# Patient Record
Sex: Male | Born: 1995 | Race: White | Hispanic: No | Marital: Single | Smoking: Former smoker
Health system: Southern US, Community
[De-identification: ages and names within clinical notes are randomized; demographics above are authoritative.]

## PROBLEM LIST (undated history)

## (undated) DIAGNOSIS — F329 Major depressive disorder, single episode, unspecified: Secondary | ICD-10-CM

## (undated) DIAGNOSIS — J309 Allergic rhinitis, unspecified: Secondary | ICD-10-CM

## (undated) HISTORY — DX: Allergic rhinitis, unspecified: J30.9

## (undated) HISTORY — DX: Major depressive disorder, single episode, unspecified: F32.9

## (undated) HISTORY — PX: NO PAST SURGERIES: SHX2092

---
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
## 2016-01-28 ENCOUNTER — Encounter: Payer: Self-pay | Admitting: Internal Medicine

## 2016-01-28 ENCOUNTER — Ambulatory Visit (INDEPENDENT_AMBULATORY_CARE_PROVIDER_SITE_OTHER): Payer: BLUE CROSS/BLUE SHIELD | Admitting: Internal Medicine

## 2016-01-28 VITALS — BP 112/68 | HR 104 | Temp 98.2°F | Resp 12 | Ht 74.0 in | Wt 160.1 lb

## 2016-01-28 DIAGNOSIS — J0100 Acute maxillary sinusitis, unspecified: Secondary | ICD-10-CM | POA: Diagnosis not present

## 2016-01-28 MED ORDER — AMOXICILLIN 875 MG PO TABS: 875.0000 mg | ORAL_TABLET | Freq: Two times a day (BID) | ORAL | 0 refills | Status: DC

## 2016-01-28 NOTE — Progress Notes (Signed)
   Subjective:    Patient ID: Joshua [name], male    DOB: 03/08/1996, 20 y.o.   MRN: [id number]  DOS:  01/28/2016 Type of visit - description : Acute visit Interval history: Symptoms started 2 weeks ago with sinus congestion, 5 days ago he also developed chest congestion At this point the worst symptom is left-sided facial pressure around the eye, on and off throughout the day, usually better after he blows yellow discharge from the sinuses. Yesterday he saw a mild amount of blood from the ear after he used a Q-tip   Review of Systems Denies fever chills + Cough with clear to white sputum production  Past Medical History:  Diagnosis Date  . Allergic rhinitis     Past Surgical History:  Procedure Laterality Date  . NO PAST SURGERIES      Social History   Social History  . Marital status: Single    Spouse name: N/A  . Number of children: N/A  . Years of education: N/A   Occupational History  . Not on file.   Social History Main Topics  . Smoking status: Never Smoker  . Smokeless tobacco: Never Used  . Alcohol use No  . Drug use: No  . Sexual activity: Not on file   Other Topics Concern  . Not on file   Social History Narrative  . No narrative on file        Medication List       Accurate as of 01/28/16 11:13 AM. Always use your most recent med list.          fluticasone 50 MCG/ACT nasal spray Commonly known as:  FLONASE Place 2 sprays into both nostrils daily.   ibuprofen 800 MG tablet Commonly known as:  ADVIL,MOTRIN Take 1 tablet (800 mg total) by mouth 3 (three) times daily.          Objective:   Physical Exam BP 112/68 (BP Location: Left Arm, Patient Position: Sitting, Cuff Size: Normal)   Pulse (!) 104   Temp 98.2 F (36.8 C) (Oral)   Resp 12   Ht 6\' 2"  (1.88 m)   Wt 160 lb 2 oz (72.6 kg)   SpO2 99%   BMI 20.56 kg/m  General:   Well developed, well nourished . NAD.  HEENT:  Normocephalic . Face symmetric, atraumatic. TMs: Both  are slightly bulge but not read. Canals are normal except for a very small excoriation on the left. No active bleeding. Throat symmetric. Sinuses: Slightly TTP at the left maxillary area.  EOMI Lungs:  CTA B Normal respiratory effort, no intercostal retractions, no accessory muscle use. Heart: RRR,  no murmur.  No pretibial edema bilaterally  Skin: Not pale. Not jaundice Neurologic:  alert & oriented X3.  Speech normal, gait appropriate for age and unassisted Psych--  Cognition and judgment appear intact.  Cooperative with normal attention span and concentration.  Behavior appropriate. No anxious or depressed appearing.      Assessment & Plan:   Assessment Allergic rhinitis C. diff 2015   Plan: Acute sinusitis: Clinical picture consistent with acute sinusitis, he does need an antibiotic; he has a history of C. difficile. Plan: Amoxicillin for 7 days, probiotics, call if problems, see AVS.

## 2016-01-28 NOTE — Progress Notes (Signed)
Pre visit review using our clinic review tool, if applicable. No additional management support is needed unless otherwise documented below in the visit note. 

## 2016-01-28 NOTE — Patient Instructions (Signed)
Start taking the antibiotic for one week  Flonase over-the-counter 2 sprays on each side of the nose every day  Mucinex twice a day for 4-5 days then as needed  For the next 3 weeks take over-the-counter probiotic such as ALIGN or similar to prevent a C. difficile infection  Call if you are not gradually improving  Call if you have diarrhea

## 2016-01-29 DIAGNOSIS — Z09 Encounter for follow-up examination after completed treatment for conditions other than malignant neoplasm: Secondary | ICD-10-CM | POA: Insufficient documentation

## 2016-01-29 NOTE — Assessment & Plan Note (Signed)
Acute sinusitis: Clinical picture consistent with acute sinusitis, he does need an antibiotic; he has a history of C. difficile. Plan: Amoxicillin for 7 days, probiotics, call if problems, see AVS.

## 2016-02-04 ENCOUNTER — Telehealth: Payer: Self-pay | Admitting: Internal Medicine

## 2016-02-04 DIAGNOSIS — M25519 Pain in unspecified shoulder: Secondary | ICD-10-CM

## 2016-02-04 NOTE — Telephone Encounter (Signed)
Caller name: Relationship to patient:Self Can be reached: [phone] Pharmacy:  Reason for call: Request referral to Orthopedic (per conversation with provider) for his shoulder.

## 2016-02-04 NOTE — Telephone Encounter (Signed)
Please advise 

## 2016-02-05 NOTE — Telephone Encounter (Signed)
Referral placed.

## 2016-02-05 NOTE — Telephone Encounter (Signed)
Ok to refer.

## 2016-02-19 ENCOUNTER — Encounter (INDEPENDENT_AMBULATORY_CARE_PROVIDER_SITE_OTHER): Payer: Self-pay | Admitting: Orthopedic Surgery

## 2016-02-19 ENCOUNTER — Ambulatory Visit (INDEPENDENT_AMBULATORY_CARE_PROVIDER_SITE_OTHER): Payer: BLUE CROSS/BLUE SHIELD

## 2016-02-19 ENCOUNTER — Ambulatory Visit (INDEPENDENT_AMBULATORY_CARE_PROVIDER_SITE_OTHER): Payer: BLUE CROSS/BLUE SHIELD | Admitting: Orthopedic Surgery

## 2016-02-19 ENCOUNTER — Telehealth (INDEPENDENT_AMBULATORY_CARE_PROVIDER_SITE_OTHER): Payer: Self-pay | Admitting: Orthopedic Surgery

## 2016-02-19 DIAGNOSIS — M25511 Pain in right shoulder: Secondary | ICD-10-CM | POA: Diagnosis not present

## 2016-02-19 DIAGNOSIS — M419 Scoliosis, unspecified: Secondary | ICD-10-CM

## 2016-02-19 NOTE — Progress Notes (Signed)
Office Visit Note   Patient: Joshua [name]           Date of Birth: 1995-11-15           MRN: [id number] Visit Date: 02/19/2016 Requested by: Wanda PlumpJose E Paz, MD [address] [place], [place] [id number] PCP: Willow OraJose Paz, MD  Subjective: Chief Complaint  Patient presents with  . Right Shoulder - Pain    HPI no is a 20 year old patient has had a several year history of right shoulder pain.  Denies history of injury.  States that his shoulder blade "juts out".  He notices it when he sitting and resting on the back of a chair.  Recently it is gotten to the point where it's painful for him to reach his arm out.  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  He denies any neck pain leg pain arm radicular symptoms or any weakness.              Review of Systems All systems reviewed are negative as they relate to the chief complaint within the history of present illness.  Patient denies  fevers or chills.    Assessment & Plan: Visit Diagnoses:  1. 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  Shoulder x-rays and exam otherwise normal.  We talked about physical therapy and exercise as well as surgical correction.  He is going to follow-up as needed but doesn't really want to do anything yet.  Follow-Up Instructions: Return if symptoms worsen or fail to improve.   Orders:  Orders Placed This Encounter  Procedures  . XR Shoulder Right   No orders of the defined types were placed in this encounter.     Procedures: No procedures performed   Clinical Data: No additional findings.  Objective: Vital Signs: There were no vitals taken for this visit.  Physical Exam  Constitutional: He appears well-developed.  HENT:  Head: Normocephalic.  Eyes: EOM are normal.  Neck: Normal range  of motion.  Cardiovascular: Normal rate.   Pulmonary/Chest: Effort normal.  Neurological: He is alert.  Skin: Skin is warm.  Psychiatric: He has a normal mood and affect.    Ortho Exam examination of the right shoulder demonstrates full active and passive range of motion.  He does have prominent scapula.  He has rotational thoracic hump on forward bending right higher than left.  Overall the patient is balanced.  There is no significant lumbar scoliosis component on forward bending.  No leg muscle atrophy is noted.  Motor sensory function in bilateral upper extremities is intact with 5 out of 5 EPL FPL interosseous wrist flexionextension biceps triceps and deltoid strength.  There is no scapulothoracic crepitus.  Radiographs do demonstrate around the 20-25 cervicothoracic scoliosis.  Specialty Comments:  No specialty comments available.  Imaging: Xr Shoulder Right  Result Date: 02/19/2016 AP outlet and axillary right shoulder reviewed.  Cervicothoracic scoliosis is present.  Lung fields otherwise are unremarkable area and the humeral joint is reduced.  No abnormal calcifications present.  Acromioclavicular joint is intact and reduced    PMFS History: Patient Active Problem List   Diagnosis Date Noted  . PCP NOTES >>>>>>>>>>>>>. 01/29/2016  . Annual physical exam 09/02/2014  . Sinus infection 03/03/2014  . Allergic rhinitis   . GASTROENTERITIS 05/21/2009  .  MIGRAINE HEADACHE 01/14/2008   Past Medical History:  Diagnosis Date  . Allergic rhinitis     No family history on file.  Past Surgical History:  Procedure Laterality Date  . NO PAST SURGERIES     Social History   Occupational History  . Not on file.   Social History Main Topics  . Smoking status: Never Smoker  . Smokeless tobacco: Never Used  . Alcohol use No  . Drug use: No  . Sexual activity: Not on file

## 2016-02-19 NOTE — Telephone Encounter (Signed)
Please advise on referral

## 2016-02-19 NOTE — Telephone Encounter (Signed)
Patient was seen this morning and states Dr August Saucerean had mentioned doing a referral for a provider specializing in scoliosis diagnosis.  Please follow through.

## 2016-02-24 NOTE — Telephone Encounter (Signed)
IC pt and advised we are making a referral to [facility] and someone should be calling him. LMVM

## 2016-02-24 NOTE — Telephone Encounter (Signed)
pls refer to dr frino at [facility] thx

## 2016-04-13 ENCOUNTER — Ambulatory Visit: Payer: BLUE CROSS/BLUE SHIELD | Admitting: Internal Medicine

## 2016-06-07 DIAGNOSIS — M4135 Thoracogenic scoliosis, thoracolumbar region: Secondary | ICD-10-CM | POA: Diagnosis not present

## 2016-06-07 DIAGNOSIS — M4185 Other forms of scoliosis, thoracolumbar region: Secondary | ICD-10-CM | POA: Diagnosis not present

## 2016-06-23 ENCOUNTER — Telehealth: Payer: Self-pay | Admitting: Internal Medicine

## 2016-06-23 NOTE — Telephone Encounter (Signed)
Patient left message on voicemail 06/23/2016 for someone to call him back. Called back to [phone] and left a message that his call was being returned and he could call back for assistance.

## 2016-06-29 ENCOUNTER — Ambulatory Visit (INDEPENDENT_AMBULATORY_CARE_PROVIDER_SITE_OTHER): Payer: BLUE CROSS/BLUE SHIELD | Admitting: Internal Medicine

## 2016-06-29 ENCOUNTER — Ambulatory Visit: Payer: BLUE CROSS/BLUE SHIELD | Admitting: Internal Medicine

## 2016-06-29 ENCOUNTER — Encounter: Payer: Self-pay | Admitting: Internal Medicine

## 2016-06-29 VITALS — BP 118/68 | HR 67 | Temp 98.0°F | Resp 14 | Ht 74.0 in | Wt 163.2 lb

## 2016-06-29 DIAGNOSIS — F909 Attention-deficit hyperactivity disorder, unspecified type: Secondary | ICD-10-CM

## 2016-06-29 DIAGNOSIS — F419 Anxiety disorder, unspecified: Secondary | ICD-10-CM | POA: Diagnosis not present

## 2016-06-29 DIAGNOSIS — Z79899 Other long term (current) drug therapy: Secondary | ICD-10-CM

## 2016-06-29 DIAGNOSIS — F988 Other specified behavioral and emotional disorders with onset usually occurring in childhood and adolescence: Secondary | ICD-10-CM | POA: Diagnosis not present

## 2016-06-29 LAB — COMPREHENSIVE METABOLIC PANEL
ALT: 16 U/L (ref 0–53)
AST: 16 U/L (ref 0–37)
Albumin: 4.4 g/dL (ref 3.5–5.2)
Alkaline Phosphatase: 98 U/L (ref 39–117)
BUN: 12 mg/dL (ref 6–23)
CO2: 28 meq/L (ref 19–32)
Calcium: 9.5 mg/dL (ref 8.4–10.5)
Chloride: 104 mEq/L (ref 96–112)
Creatinine, Ser: 0.91 mg/dL (ref 0.40–1.50)
GFR: 112.85 mL/min (ref >60.00)
GLUCOSE: 91 mg/dL (ref 70–99)
Potassium: 3.4 mEq/L — ABNORMAL LOW (ref 3.5–5.1)
SODIUM: 140 meq/L (ref 135–145)
Total Bilirubin: 0.6 mg/dL (ref 0.2–1.2)
Total Protein: 7.2 g/dL (ref 6.0–8.3)

## 2016-06-29 LAB — CBC WITH DIFFERENTIAL/PLATELET
BASOS PCT: 0.9 % (ref 0.0–3.0)
Basophils Absolute: 0.1 10*3/uL (ref 0.0–0.1)
Eosinophils Absolute: 0.1 10*3/uL (ref 0.0–0.7)
Eosinophils Relative: 2.4 % (ref 0.0–5.0)
HCT: 46 % (ref 39.0–52.0)
Hemoglobin: 15.5 g/dL (ref 13.0–17.0)
Lymphocytes Relative: 30.7 % (ref 12.0–46.0)
Lymphs Abs: 1.8 10*3/uL (ref 0.7–4.0)
MCHC: 33.7 g/dL (ref 30.0–36.0)
MCV: 91.4 fl (ref 78.0–100.0)
MONO ABS: 0.5 10*3/uL (ref 0.1–1.0)
Monocytes Relative: 8 % (ref 3.0–12.0)
NEUTROS ABS: 3.5 10*3/uL (ref 1.4–7.7)
Neutrophils Relative %: 58 % (ref 43.0–77.0)
PLATELETS: 247 10*3/uL (ref 150.0–400.0)
RBC: 5.03 Mil/uL (ref 4.22–5.81)
RDW: 13 % (ref 11.5–14.6)
WBC: 6 10*3/uL (ref 4.5–10.5)

## 2016-06-29 LAB — TSH: TSH: 1.32 u[IU]/mL (ref 0.35–5.50)

## 2016-06-29 NOTE — Progress Notes (Signed)
Pre visit review using our clinic review tool, if applicable. No additional management support is needed unless otherwise documented below in the visit note. 

## 2016-06-29 NOTE — Patient Instructions (Signed)
GO TO THE LAB : Get the blood work     GO TO THE FRONT DESK Schedule your next appointment for a  checkup in 2 months if you have not been able to see the psychiatrist  We are referring you to [facility], if you don't hear from them in the next 3 days, please call them or call  Dr. Delorse Lek [phone]

## 2016-06-29 NOTE — Progress Notes (Signed)
Subjective:    Patient ID: Joshua [name], male    DOB: 05/17/1995, 21 y.o.   MRN: [id number]  DOS:  06/29/2016 Type of visit - description : Acute visit, treatment for ADD? Interval history:  She was diagnosed with ADD as a child, tried Adderall, did not like it much and self discontinued.  He finished high school, had good grades even without  Much effort. He is currently at [employer] ,, he continue struggling with difficulty concentrating and is creating difficulties; he sometimes get bored at the classroom. When asked, admits to anxiety for long time, on and off, usually anticipatory anxiety. Also, he admits to "cyclical" depression, he sometimes feels very depressed and occasional has suicidal thoughts other times feels very well and admits to overeat and sometimes overspend. Cycles are approximately every 3 months.  Review of Systems  He is occasionally smokes marijuana, once a month, less than before. Use of EtOH. Has tried "mushrooms" few months ago.   Past Medical History:  Diagnosis Date  . Allergic rhinitis     Past Surgical History:  Procedure Laterality Date  . NO PAST SURGERIES      Social History   Social History  . Marital status: Single    Spouse name: N/A  . Number of children: N/A  . Years of education: N/A   Occupational History  . Not on file.   Social History Main Topics  . Smoking status: Never Smoker  . Smokeless tobacco: Never Used  . Alcohol use No  . Drug use: No  . Sexual activity: Not on file   Other Topics Concern  . Not on file   Social History Narrative  . No narrative on file      Allergies as of 06/29/2016   No Known Allergies     Medication List       Accurate as of 06/29/16 11:59 PM. Always use your most recent med list.          amoxicillin 875 MG tablet Commonly known as:  AMOXIL Take 1 tablet (875 mg total) by mouth 2 (two) times daily.          Objective:   Physical Exam BP 118/68 (BP Location: Left Arm,  Patient Position: Sitting, Cuff Size: Normal)   Pulse 67   Temp 98 F (36.7 C) (Oral)   Resp 14   Ht  (1.88 m)   Wt 163 lb 4 oz (74 kg)   SpO2 99%   BMI 20.96 kg/m  General:   Well developed, well nourished . NAD.  HEENT:  Normocephalic . Face symmetric, atraumatic. Neck: No thyromegaly Lungs:  CTA B Normal respiratory effort, no intercostal retractions, no accessory muscle use. Heart: RRR,  no murmur.  No pretibial edema bilaterally  Skin: Not pale. Not jaundice Neurologic:  alert & oriented X3.  Speech normal, gait appropriate for age and unassisted Psych--  Cognition and judgment appear intact.  Cooperative with normal attention span and concentration.  Behavior appropriate. No anxious or depressed appearing.      Assessment & Plan:  Assessment Allergic rhinitis C. diff 2015  Migraines dx middle school  Plan:  Presents w/ sx of ADHD but also anxiety and possible bipolar disorder. His father has the diagnosis of bipolar. I think he needs to be formally evaluated by psychiatry or Dr. Marina Goodell. Recommend avoidance of EtOH, marijuana or other substances. Refer to psychiatry. Definitely call if he has depression again particularly if he is suicidal. General labs  including CMP, CBC and TSH.

## 2016-06-30 NOTE — Assessment & Plan Note (Signed)
Presents w/ sx of ADHD but also anxiety and possible bipolar disorder. His father has the diagnosis of bipolar. I think he needs to be formally evaluated by psychiatry or Dr. Marina Goodell. Recommend avoidance of EtOH, marijuana or other substances. Refer to psychiatry. Definitely call if he has depression again particularly if he is suicidal. General labs including CMP, CBC and TSH.

## 2016-07-12 DIAGNOSIS — F909 Attention-deficit hyperactivity disorder, unspecified type: Secondary | ICD-10-CM | POA: Diagnosis not present

## 2016-07-12 DIAGNOSIS — F29 Unspecified psychosis not due to a substance or known physiological condition: Secondary | ICD-10-CM | POA: Diagnosis not present

## 2016-07-12 DIAGNOSIS — F411 Generalized anxiety disorder: Secondary | ICD-10-CM | POA: Diagnosis not present

## 2016-08-11 ENCOUNTER — Ambulatory Visit: Payer: BLUE CROSS/BLUE SHIELD | Attending: Neurological Surgery | Admitting: Rehabilitation

## 2016-08-11 ENCOUNTER — Encounter: Payer: Self-pay | Admitting: Rehabilitation

## 2016-08-11 DIAGNOSIS — M899 Disorder of bone, unspecified: Secondary | ICD-10-CM | POA: Diagnosis not present

## 2016-08-11 DIAGNOSIS — M4123 Other idiopathic scoliosis, cervicothoracic region: Secondary | ICD-10-CM

## 2016-08-11 NOTE — Therapy (Signed)
[facility] [address]  [address] [place], [place], [address] Phone: [phone]   Fax:  [phone]  Physical Therapy Evaluation  Patient Details  Name: Joshua [name] MRN: [id number] Date of Birth: 04/09/1995 Referring Provider: Diamantina ProvidenceAlexander Powers  Encounter Date: 08/11/2016      PT End of Session - 08/11/16 1126    Visit Number 1   Date for PT Re-Evaluation 09/22/16   PT Start Time 0930   PT Stop Time 1010   PT Time Calculation (min) 40 min   Activity Tolerance Patient tolerated treatment well      Past Medical History:  Diagnosis Date  . Allergic rhinitis     Past Surgical History:  Procedure Laterality Date  . NO PAST SURGERIES      There were no vitals filed for this visit.       Subjective Assessment - 08/11/16 0928    Subjective Pt presents with c/o R shoulder pain and tightness as well as mid back tiredness with walking due to thoracic scoliosis.  Scoliosis a new diagnosis as of a few months ago.  The shoulder feels "abnormal" all of the time.  At the end of the day  it feels more uncomfortable in the shoulder blade region.  Stretching will help the pain.     Diagnostic tests xrays showing 25deg curve per pt   Patient Stated Goals learn exercises to stretch and strengthening.     Currently in Pain? No/denies   Aggravating Factors  use over a few hours with cause mid back pain and stiffness   Pain Relieving Factors stretching            [facility] PT Assessment - 08/11/16 0001      Assessment   Medical Diagnosis cervicothoracic scoliosis   Referring Provider Diamantina ProvidenceAlexander Powers   Onset Date/Surgical Date 03/21/98   Next MD Visit unknown   Prior Therapy no     Precautions   Precautions None     Restrictions   Weight Bearing Restrictions No     Balance Screen   Has the patient fallen in the past 6 months No     Prior Function   Level of Independence Independent     Observation/Other  Assessments   Focus on Therapeutic Outcomes (FOTO)  14% limited     Posture/Postural Control   Posture/Postural Control Postural limitations   Posture Comments significant thoracic convexity and torsion R, R scapular protraction and winging.  R scapula sits 2" from spine, L 4"      ROM / Strength   AROM / PROM / Strength AROM;PROM;Strength     AROM   Overall AROM Comments shoulder WNL   AROM Assessment Site Cervical;Thoracic;Shoulder   Cervical Flexion 35  pain   Cervical Extension 60   Cervical - Right Side Bend WNL   Cervical - Left Side Bend WNL   Cervical - Right Rotation WNL   Cervical - Left Rotation WNL   Thoracic Flexion decreased 20% with pain   Thoracic Extension all motion from around T10-12   Thoracic - Right Side Bend WNL   Thoracic - Left Side Bend WNL   Thoracic - Right Rotation WNL   Thoracic - Left Rotation WNL     PROM   Overall PROM Comments very limited into thoracic extension in seated     Strength   Overall Strength Comments serratus 5/5, MT/UT 5/5, LT 4-/5 R   Strength Assessment Site Shoulder;Thoracic  Right/Left Shoulder Right   Right Shoulder Flexion 4+/5   Right Shoulder ABduction 5/5   Right Shoulder Internal Rotation 4+/5   Right Shoulder External Rotation 4/5     Palpation   Palpation comment increased tone and tenderness R rhomboids, LS, UT, decreased tone R LT                           PT Education - 08/11/16 1126    Education provided Yes   Education Details HEP, diagnosis, POC   Person(s) Educated Patient   Methods Explanation;Demonstration;Tactile cues;Verbal cues;Handout   Comprehension Returned demonstration;Verbal cues required;Verbalized understanding;Tactile cues required          PT Short Term Goals - 08/11/16 1132      PT SHORT TERM GOAL #1   Title pt will be ind with intial HEP   Time 2   Period Weeks   Status New           PT Long Term Goals - 08/11/16 1133      PT LONG TERM GOAL #1    Title pt will be ind with final HEP to help with continued relief of R shoulder/scapular/and thoracic stiffness   Time 6   Period Weeks   Status New     PT LONG TERM GOAL #2   Title pt will improve R shoulder supine off of the table distance to 6in (currently 8in)   Time 6   Period Weeks   Status New     PT LONG TERM GOAL #3   Title pt will improve cervical flexion AROM to full without pain   Time 6   Period Weeks               Plan - 08/11/16 1127    Clinical Impression Statement Pt presents to PT with compaints of R shoulder and thoracic stiffness, fatigue, and occasional pain due to a moderate cervicothoracic scoliosis including a torsional component.  Deficits include R scapular malpositioning into protraction, winging, and elevation resulting in forward shoulders, and poor scapular rhythm with shoulder movements.  Tightness and pain present in the R UT, LS, and rhomboids and weakness in the LT/lower thoracic musculature.  Contributions also from thoracic stiffness into extension and cervical stiffness and pain into flexion.     Rehab Potential Excellent   PT Frequency 2x / week   PT Duration 6 weeks   PT Treatment/Interventions Taping;Passive range of motion;Manual techniques;Therapeutic exercise;Neuromuscular re-education;Moist Heat;Electrical Stimulation   PT Next Visit Plan scapular mobility/TE, anterior chest/UT/LS/thoracic mobility, STM prn, scapular reeducation   Consulted and Agree with Plan of Care Patient      Patient will benefit from skilled therapeutic intervention in order to improve the following deficits and impairments:  Pain, Impaired flexibility, Decreased mobility  Visit Diagnosis: Scapular dysfunction - Plan: PT plan of care cert/re-cert  Other idiopathic scoliosis, cervicothoracic region - Plan: PT plan of care cert/re-cert     Problem List Patient Active Problem List   Diagnosis Date Noted  . PCP NOTES >>>>>>>>>>>>>. 01/29/2016  . Annual  physical exam 09/02/2014  . Allergic rhinitis   . MIGRAINE HEADACHE 01/14/2008    Idamae Lusher, DPT, CMP 08/11/2016, 11:36 AM  [facility] [address]  [address] [place], [place], [address] Phone: [phone]   Fax:  [phone]  Name: Joshua [name] MRN: [id number] Date of Birth: 03/02/1996

## 2016-08-17 ENCOUNTER — Ambulatory Visit: Payer: BLUE CROSS/BLUE SHIELD

## 2016-08-17 DIAGNOSIS — M899 Disorder of bone, unspecified: Secondary | ICD-10-CM | POA: Diagnosis not present

## 2016-08-17 DIAGNOSIS — M4123 Other idiopathic scoliosis, cervicothoracic region: Secondary | ICD-10-CM

## 2016-08-17 NOTE — Therapy (Signed)
[facility] [address]  [address] [place], [place], [address] Phone: [phone]   Fax:  [phone]  Physical Therapy Treatment  Patient Details  Name: Joshua [name] MRN: [id number] Date of Birth: Dec 14, 1995 Referring Provider: Diamantina ProvidenceAlexander Powers  Encounter Date: 08/17/2016      PT End of Session - 08/17/16 0954    Visit Number 2   Date for PT Re-Evaluation 09/22/16   PT Start Time 0936   PT Stop Time 1015   PT Time Calculation (min) 39 min   Activity Tolerance Patient tolerated treatment well      Past Medical History:  Diagnosis Date  . Allergic rhinitis     Past Surgical History:  Procedure Laterality Date  . NO PAST SURGERIES      There were no vitals filed for this visit.      Subjective Assessment - 08/17/16 0945    Subjective Pt. reports he has been performing HEP activities without issue.     Patient Stated Goals learn exercises to stretch and strengthening.     Currently in Pain? No/denies   Multiple Pain Sites No                         [facility] Adult PT Treatment/Exercise - 08/17/16 0956      Exercises   Exercises Neck     Shoulder Exercises: Supine   Protraction Both;20 reps   Protraction Weight (lbs) 5   Horizontal ABduction Both;15 reps;Theraband   Theraband Level (Shoulder Horizontal ABduction) Level 2 (Red)   Horizontal ABduction Limitations laying on 1/2 foam bolster; cues for scapular squeeze   External Rotation Both;15 reps;Theraband   Theraband Level (Shoulder External Rotation) Level 2 (Red)   External Rotation Limitations laying on 1/2 foam bolster; cues for scap squeeze; towel rolls      Shoulder Exercises: Standing   External Rotation Both;15 reps;Theraband   Theraband Level (Shoulder External Rotation) Level 2 (Red)   External Rotation Limitations leaning on 1/2 foam bolster on wall    Flexion Both;15 reps;Theraband   Theraband Level (Shoulder Flexion) Level  2 (Red)   Flexion Limitations Alternating flexion/ext. with red TB leaning on 1/2 foam bolster on wall    ABduction Both;15 reps;Theraband   Theraband Level (Shoulder ABduction) Level 2 (Red)   ABduction Limitations leaning on 1/2 foam bolster on wall    Extension Both;15 reps;Theraband   Theraband Level (Shoulder Extension) Level 2 (Red)   Extension Limitations cueing for full scap. retraction   Row Both;15 reps;Strengthening;Theraband   Theraband Level (Shoulder Row) Level 3 (Green)   Row Limitations tactile cueing for full scap. retraction   Other Standing Exercises Alternating shoulder flexion/ext. with red TB (x pattern) leaning on 1/2 foam bolster x 10 reps each way     Shoulder Exercises: ROM/Strengthening   UBE (Upper Arm Bike) UBE: lvl 1.5, 3 min each way   Cybex Row 15 reps   Cybex Row Limitations 20#; with 3" scapular squeeze with tactile cueing     Neck Exercises: Stretches   Upper Trapezius Stretch 2 reps;30 seconds   Upper Trapezius Stretch Limitations bil; arm behind back    Levator Stretch 2 reps;30 seconds   Levator Stretch Limitations bil; arm behind back                  PT Short Term Goals - 08/17/16 0954      PT SHORT TERM GOAL #1  Title pt will be ind with intial HEP   Time 2   Period Weeks   Status On-going           PT Long Term Goals - 08/17/16 0955      PT LONG TERM GOAL #1   Title pt will be ind with final HEP to help with continued relief of R shoulder/scapular/and thoracic stiffness   Time 6   Period Weeks   Status On-going     PT LONG TERM GOAL #2   Title pt will improve R shoulder supine off of the table distance to 6in (currently 8in)   Time 6   Period Weeks   Status On-going     PT LONG TERM GOAL #3   Title pt will improve cervical flexion AROM to full without pain   Time 6   Period Weeks   Status On-going               Plan - 08/17/16 1404    Clinical Impression Statement Pt. doing well today.  Good  recall and demo of HEP and tolerated all activities well.  Scapular stability activities today with constant cueing to maintain full scap. retraction/depression.  Will monitor response and update HEP to include more scapular strengthening activities in coming visits.     PT Treatment/Interventions Taping;Passive range of motion;Manual techniques;Therapeutic exercise;Neuromuscular re-education;Moist Heat;Electrical Stimulation   PT Next Visit Plan scapular mobility/TE, anterior chest/UT/LS/thoracic mobility, STM prn, scapular reeducation      Patient will benefit from skilled therapeutic intervention in order to improve the following deficits and impairments:  Pain, Impaired flexibility, Decreased mobility  Visit Diagnosis: Scapular dysfunction  Other idiopathic scoliosis, cervicothoracic region     Problem List Patient Active Problem List   Diagnosis Date Noted  . PCP NOTES >>>>>>>>>>>>>. 01/29/2016  . Annual physical exam 09/02/2014  . Allergic rhinitis   . MIGRAINE HEADACHE 01/14/2008    Kermit Balo, PTA 08/17/16 2:38 PM   [facility] [address]  [address] [place], [place], [address] Phone: [phone]   Fax:  [phone]  Name: Joshua [name] MRN: [id number] Date of Birth: Sep 24, 1995

## 2016-08-22 ENCOUNTER — Ambulatory Visit: Payer: BLUE CROSS/BLUE SHIELD | Attending: Neurological Surgery

## 2016-08-22 DIAGNOSIS — M899 Disorder of bone, unspecified: Secondary | ICD-10-CM | POA: Diagnosis present

## 2016-08-22 DIAGNOSIS — M4123 Other idiopathic scoliosis, cervicothoracic region: Secondary | ICD-10-CM

## 2016-08-22 NOTE — Therapy (Signed)
[facility] [address]  [address] [place], [place], [address] Phone: [phone]   Fax:  [phone]  Physical Therapy Treatment  Patient Details  Name: Joshua [name] MRN: [id number] Date of Birth: 11/17/1995 Referring Provider: Diamantina ProvidenceAlexander Powers  Encounter Date: 08/22/2016      PT End of Session - 08/22/16 0951    Visit Number 3   Date for PT Re-Evaluation 09/22/16   PT Start Time 0946  pt. arrived late    PT Stop Time 1032   PT Time Calculation (min) 46 min   Activity Tolerance Patient tolerated treatment well      Past Medical History:  Diagnosis Date  . Allergic rhinitis     Past Surgical History:  Procedure Laterality Date  . NO PAST SURGERIES      There were no vitals filed for this visit.      Subjective Assessment - 08/22/16 0948    Subjective Pt. noting he has had muscular soreness since working his chest at gym on Saturday.     Patient Stated Goals learn exercises to stretch and strengthening.     Currently in Pain? Yes   Pain Score 4    Pain Location Chest   Pain Orientation Right;Left   Pain Descriptors / Indicators Dull;Sore   Aggravating Factors  Lifting weights    Pain Relieving Factors stretching   Multiple Pain Sites No                         [facility] Adult PT Treatment/Exercise - 08/22/16 1000      Shoulder Exercises: Supine   Protraction Both;20 reps   Protraction Weight (lbs) 5     Shoulder Exercises: Prone   Retraction 10 reps  2 sets    Retraction Weight (lbs) 5; row    Extension 10 reps;Weights;Left  2 sets    Extension Weight (lbs) 3   Horizontal ABduction 1 10 reps;Left  2 sets    Horizontal ABduction 1 Limitations no weight    Other Prone Exercises Prone partial pushup peanut p-ball roll outs hanging off edge of table x 20 reps     Shoulder Exercises: Standing   Protraction Both;Strengthening;15 reps   Protraction Limitations in pushup position on  green p-ball on wall    Other Standing Exercises B shoulder pulldown/extension with green TB in top of door x 10 reps; Constant cueing for scapular depression/retraction     Shoulder Exercises: ROM/Strengthening   UBE (Upper Arm Bike) UBE: lvl 2.0, 3 min each way   Other ROM/Strengthening Exercises BATCA pushup plus 20# x 20 reps     Neck Exercises: Stretches   Upper Trapezius Stretch 2 reps;30 seconds   Upper Trapezius Stretch Limitations bil; arm behind back    Levator Stretch 2 reps;30 seconds   Levator Stretch Limitations bil; arm behind back   Chest Stretch 1 rep;60 seconds  at doorframe in elevated positions    Other Neck Stretches Supine chest stretch in elevated B ER position x 2 min laying on 1/2 foam bolster; light overpressure; pt. verbalizing intensity of pec stretch                 PT Education - 08/22/16 1051    Education provided Yes   Education Details Elevated chest stretch on doorframe    Person(s) Educated Patient   Methods Explanation;Demonstration;Verbal cues;Handout   Comprehension Verbalized understanding;Returned demonstration;Verbal cues required;Need further instruction  PT Short Term Goals - 08/17/16 0954      PT SHORT TERM GOAL #1   Title pt will be ind with intial HEP   Time 2   Period Weeks   Status On-going           PT Long Term Goals - 08/17/16 0955      PT LONG TERM GOAL #1   Title pt will be ind with final HEP to help with continued relief of R shoulder/scapular/and thoracic stiffness   Time 6   Period Weeks   Status On-going     PT LONG TERM GOAL #2   Title pt will improve R shoulder supine off of the table distance to 6in (currently 8in)   Time 6   Period Weeks   Status On-going     PT LONG TERM GOAL #3   Title pt will improve cervical flexion AROM to full without pain   Time 6   Period Weeks   Status On-going               Plan - 08/22/16 0955    Clinical Impression Statement Pt. noting dull  muscular soreness in chest after performing bench press at gym on Saturday.  Some discussion with pt. today regarding appropriate gym exercises to promote improved posture.  Pt. tolerated all scapular strengthening and stability activities well today.  Pt. with good verbal recall of HEP activities today and reports consistent adherence.  Chest stretch added to HEP today.  Will continue to progress scapular stability activities and postural strengthening per pt. tolerance in coming visits.   PT Treatment/Interventions Taping;Passive range of motion;Manual techniques;Therapeutic exercise;Neuromuscular re-education;Moist Heat;Electrical Stimulation   PT Next Visit Plan scapular mobility/TE, anterior chest/UT/LS/thoracic mobility, STM prn, scapular reeducation      Patient will benefit from skilled therapeutic intervention in order to improve the following deficits and impairments:  Pain, Impaired flexibility, Decreased mobility  Visit Diagnosis: Scapular dysfunction  Other idiopathic scoliosis, cervicothoracic region     Problem List Patient Active Problem List   Diagnosis Date Noted  . PCP NOTES >>>>>>>>>>>>>. 01/29/2016  . Annual physical exam 09/02/2014  . Allergic rhinitis   . MIGRAINE HEADACHE 01/14/2008    Kermit Balo, PTA 08/22/16 10:52 AM  [facility] [address]  [address] [place], [place], [address] Phone: [phone]   Fax:  [phone]  Name: Joshua [name] MRN: [id number] Date of Birth: [birth year]

## 2016-08-25 ENCOUNTER — Ambulatory Visit: Payer: BLUE CROSS/BLUE SHIELD

## 2016-08-29 ENCOUNTER — Ambulatory Visit: Payer: BLUE CROSS/BLUE SHIELD

## 2016-08-29 DIAGNOSIS — M4123 Other idiopathic scoliosis, cervicothoracic region: Secondary | ICD-10-CM

## 2016-08-29 DIAGNOSIS — M899 Disorder of bone, unspecified: Secondary | ICD-10-CM | POA: Diagnosis not present

## 2016-08-29 NOTE — Patient Instructions (Signed)
Screening for Suicide  Answer the following questions with Yes or No and place an "x" beside the action taken.  1. Over the past two weeks, have you felt down, depressed, or hopeless?   Yes    2. Within the past two weeks, have you felt little interest or pleasure in life?    Yes   If YES to either #1 or #2, then ask #3  3. Have you had thoughts that that life is not worth living or that you might be       better off dead?     Yes   If answer is NO and suspicion is low, then end   4. Over this past week, have you had any thoughts about hurting or even killing yourself?    Yes   If NO, then end. Patient in no immediate danger   5. If so, do you believe that you intend to or will harm yourself?    Maybe      If NO, then end. Patient in no immediate danger   6.  Do you have a plan as to how you would hurt yourself?    Yes    7.  Over this past week, have you actually done anything to hurt yourself?    No IF YES answers to either #4, #5, #6 or #7, then patient is AT RISK for suicide   Actions Taken  ____  Screening negative; no further action required  ____  Screening positive; no immediate danger and patient already in treatment with a  mental health provider. Advise patient to speak to their mental health provider.  ____  Screening positive; no immediate danger. Patient advised to contact a mental  health provider for further assessment.   _X___  Screening positive; in immediate danger as patient states intention of killing self,  has plan and a sense of imminence. Do not leave alone. Seek permission from  patient to contact a family member to inform them. Direct patient to go to ED.

## 2016-08-29 NOTE — Therapy (Signed)
[facility] [address]  [address] [place], [place], [address] Phone: [phone]   Fax:  [phone]  Physical Therapy Treatment  Patient Details  Name: Joshua [name] MRN: [id number] Date of Birth: 09-29-1995 Referring Provider: Diamantina ProvidenceAlexander Powers  Encounter Date: 08/29/2016      PT End of Session - 08/29/16 0944    Visit Number 4   Date for PT Re-Evaluation 09/22/16   PT Start Time 0935   PT Stop Time 1040  treatment ending with moist heat E-stim 20 min    PT Time Calculation (min) 65 min   Activity Tolerance Patient tolerated treatment well      Past Medical History:  Diagnosis Date  . Allergic rhinitis     Past Surgical History:  Procedure Laterality Date  . NO PAST SURGERIES      There were no vitals filed for this visit.      Subjective Assessment - 08/29/16 0938    Subjective Pt. verbalizing he has been struggling with depression last few weeks and has history of this.  Pt. admitting to not taking anti-depression medications (Seroquel) and does not have f/u scheduled with mental health professional at this time.     Patient Stated Goals learn exercises to stretch and strengthening.     Currently in Pain? No/denies   Pain Score 0-No pain   Multiple Pain Sites No                         [facility] Adult PT Treatment/Exercise - 08/29/16 1216      Self-Care   Self-Care Other Self-Care Comments   Other Self-Care Comments  Suiside screen and discussion of pt. well being      Shoulder Exercises: Standing   Other Standing Exercises B shoulder pulldown/extension with green TB in top of door x 10 reps; Constant cueing for scapular depression/retraction     Shoulder Exercises: ROM/Strengthening   UBE (Upper Arm Bike) UBE: lvl 2.5, 3 min each way     Modalities   Modalities Electrical Stimulation;Moist Heat     Moist Heat Therapy   Number Minutes Moist Heat 20 Minutes   Moist Heat Location  Cervical  upper back      Electrical Stimulation   Electrical Stimulation Location R scapular    Electrical Stimulation Action IFC   Electrical Stimulation Parameters 80-150Hz , intensity to pt. tolerance, 20'    Electrical Stimulation Goals Pain;Tone                  PT Short Term Goals - 08/17/16 0954      PT SHORT TERM GOAL #1   Title pt will be ind with intial HEP   Time 2   Period Weeks   Status On-going           PT Long Term Goals - 08/17/16 0955      PT LONG TERM GOAL #1   Title pt will be ind with final HEP to help with continued relief of R shoulder/scapular/and thoracic stiffness   Time 6   Period Weeks   Status On-going     PT LONG TERM GOAL #2   Title pt will improve R shoulder supine off of the table distance to 6in (currently 8in)   Time 6   Period Weeks   Status On-going     PT LONG TERM GOAL #3   Title pt will improve cervical flexion AROM to full  without pain   Time 6   Period Weeks   Status On-going               Plan - 08/29/16 0955    Clinical Impression Statement Pt. entering therapy complaining of depression.  Pt. verbalizing he has been struggling with depression for past two weeks today and noting he has had limited relief from consultation with psychiatrist and is not currently taking anti-depressive medication (Seroquel).  With discussion, pt. verbalizing possibility of self-harm thus suicide screening performed (see pt. instruction).  Screening revealed pt. at risk for suicide.  Pt. in company of therapist for duration of treatment and secondary PT consulted.  PT and therapist discussed walking to ED with pt. agreeing to this.  Pt. screened by ED physician and released with instruction to contact mental health professional for further counseling.            PT Treatment/Interventions Taping;Passive range of motion;Manual techniques;Therapeutic exercise;Neuromuscular re-education;Moist Heat;Electrical Stimulation   PT Next  Visit Plan scapular mobility/TE, anterior chest/UT/LS/thoracic mobility, STM prn, scapular reeducation      Patient will benefit from skilled therapeutic intervention in order to improve the following deficits and impairments:  Pain, Impaired flexibility, Decreased mobility  Visit Diagnosis: Scapular dysfunction  Other idiopathic scoliosis, cervicothoracic region     Problem List Patient Active Problem List   Diagnosis Date Noted  . PCP NOTES >>>>>>>>>>>>>. 01/29/2016  . Annual physical exam 09/02/2014  . Allergic rhinitis   . MIGRAINE HEADACHE 01/14/2008    Kermit Balo, PTA 08/29/16 12:52 PM  [facility] [address]  [address] [place], [place], [address] Phone: [phone]   Fax:  [phone]  Name: Joshua [name] MRN: [id number] Date of Birth: 1996/01/22

## 2016-09-01 ENCOUNTER — Ambulatory Visit: Payer: BLUE CROSS/BLUE SHIELD | Admitting: Physical Therapy

## 2016-09-05 ENCOUNTER — Ambulatory Visit: Payer: BLUE CROSS/BLUE SHIELD | Admitting: Physical Therapy

## 2016-09-05 ENCOUNTER — Ambulatory Visit: Payer: BLUE CROSS/BLUE SHIELD

## 2016-09-08 ENCOUNTER — Ambulatory Visit: Payer: BLUE CROSS/BLUE SHIELD

## 2016-09-08 ENCOUNTER — Ambulatory Visit: Payer: BLUE CROSS/BLUE SHIELD | Admitting: Physical Therapy

## 2016-09-12 ENCOUNTER — Ambulatory Visit: Payer: BLUE CROSS/BLUE SHIELD

## 2016-09-12 DIAGNOSIS — M899 Disorder of bone, unspecified: Secondary | ICD-10-CM | POA: Diagnosis not present

## 2016-09-12 DIAGNOSIS — M4123 Other idiopathic scoliosis, cervicothoracic region: Secondary | ICD-10-CM

## 2016-09-12 NOTE — Therapy (Signed)
[facility] [place] [address]  [address] [place], [place], [address] Phone: [phone]   Fax:  [phone]  Physical Therapy Treatment  Patient Details  Name: Joshua [name] MRN: [id number] Date of Birth: 10/24/1995 Referring Provider: Alexander Powers  Encounter Date: 09/12/2016      PT End of Session - 09/12/16 1318    Visit Number 5   Date for PT Re-Evaluation 09/22/16   PT Start Time 1312   PT Stop Time 1355   PT Time Calculation (min) 43 min   Activity Tolerance Patient tolerated treatment well      Past Medical History:  Diagnosis Date  . Allergic rhinitis     Past Surgical History:  Procedure Laterality Date  . NO PAST SURGERIES      There were no vitals filed for this visit.      Subjective Assessment - 09/12/16 1313    Subjective Pt. noting he has been, "feeling down however I feel I am coming out of it".  Pt. noting he has upcoming appointment with mental health professional tomorrow 6.26.18 regarding depression.     Patient Stated Goals learn exercises to stretch and strengthening.     Currently in Pain? No/denies   Pain Score 0-No pain   Multiple Pain Sites No            [facility] PT Assessment - 09/12/16 1327      AROM   Overall AROM Comments shoulder WNL   AROM Assessment Site Cervical   Cervical Flexion 37   Cervical Extension 65                     [facility] Adult PT Treatment/Exercise - 09/12/16 1317      Shoulder Exercises: Supine   Protraction Both;20 reps  2 sets    Protraction Weight (lbs) 7     Shoulder Exercises: Prone   Flexion Right;10 reps   Flexion Weight (lbs) 2   Flexion Limitations R UE hanging off table   Extension 15 reps;Both   Theraband Level (Shoulder Extension) --  "I's" on green p-ball    Horizontal ABduction 1 15 reps;Both   Horizontal ABduction 1 Limitations "T's"; on green p-ball    Horizontal ABduction 2 15 reps;Both   Horizontal ABduction 2  Limitations "Y's"; on green p-ball      Shoulder Exercises: Standing   Other Standing Exercises Serratus wall slide on green P-ball with green TB around elbows x 10 reps   Other Standing Exercises Counter partial pushup with elbows at trunk x 10 reps     Shoulder Exercises: ROM/Strengthening   UBE (Upper Arm Bike) UBE: lvl 2.5, 3 min each way     Neck Exercises: Stretches   Upper Trapezius Stretch 2 reps;30 seconds   Upper Trapezius Stretch Limitations bil; arm behind back    Levator Stretch 2 reps;30 seconds   Levator Stretch Limitations bil; arm behind back   Chest Stretch 3 reps;30 seconds   Other Neck Stretches at doorway; 3-way                 PT Education - 09/12/16 1501    Education provided Yes   Education Details serratus Wall slide with green TB issued to pt., Prone shoulder flexion, chin tuck   Person(s) Educated Patient   Methods Explanation;Demonstration;Verbal cues;Handout   Comprehension Verbalized understanding;Returned demonstration;Verbal cues required;Need further instruction          PT Short Term Goals -   08/17/16 0954      PT SHORT TERM GOAL #1   Title pt will be ind with intial HEP   Time 2   Period Weeks   Status On-going           PT Long Term Goals - 09/12/16 1319      PT LONG TERM GOAL #1   Title pt will be ind with final HEP to help with continued relief of R shoulder/scapular/and thoracic stiffness   Time 6   Period Weeks   Status On-going     PT LONG TERM GOAL #2   Title pt will improve R shoulder supine off of the table distance to 6in (currently 8in)   Time 6   Period Weeks   Status On-going     PT LONG TERM GOAL #3   Title pt will improve cervical flexion AROM to full without pain   Time 6   Period Weeks   Status On-going               Plan - 09/12/16 1329    Clinical Impression Statement Pt. doing well today.  Noting he feels less depressed as of this past week and feels he is "coming out of it".  Has  appointment with mental health tomorrow 6.26 in regards to depression.  Cervical flexion AROM goal not yet met at this time.  Pt. still demonstrating R scapular winging and notes still has R scapular pain with some reaching movements.  HEP updated to include more strengthening activities.  Treatment with scapular stability activities and serratus anterior strengthening.  Pt. admitting to poor HEP adherence and instructed on importance of consistent, daily adherence for full benefit from therapy.  Will monitor tolerance to updated HEP in coming visits.     PT Treatment/Interventions Taping;Passive range of motion;Manual techniques;Therapeutic exercise;Neuromuscular re-education;Moist Heat;Electrical Stimulation   PT Next Visit Plan Monitor tolerance to updated HEP; scapular mobility/TE, anterior chest/UT/LS/thoracic mobility, STM prn, scapular reeducation      Patient will benefit from skilled therapeutic intervention in order to improve the following deficits and impairments:  Pain, Impaired flexibility, Decreased mobility  Visit Diagnosis: Scapular dysfunction  Other idiopathic scoliosis, cervicothoracic region     Problem List Patient Active Problem List   Diagnosis Date Noted  . PCP NOTES >>>>>>>>>>>>>. 01/29/2016  . Annual physical exam 09/02/2014  . Allergic rhinitis   . MIGRAINE HEADACHE 01/14/2008     , PTA 09/12/16 5:58 PM  [facility] [place] [address]  [address] [place], [place], [address] Phone: [phone]   Fax:  [phone]  Name: Cassidy A Magda MRN: [id number] Date of Birth: 12/23/1995   

## 2016-09-13 ENCOUNTER — Ambulatory Visit (INDEPENDENT_AMBULATORY_CARE_PROVIDER_SITE_OTHER): Payer: BLUE CROSS/BLUE SHIELD | Admitting: Psychology

## 2016-09-13 DIAGNOSIS — F331 Major depressive disorder, recurrent, moderate: Secondary | ICD-10-CM

## 2016-09-15 ENCOUNTER — Ambulatory Visit: Payer: BLUE CROSS/BLUE SHIELD

## 2016-09-15 DIAGNOSIS — M899 Disorder of bone, unspecified: Secondary | ICD-10-CM | POA: Diagnosis not present

## 2016-09-15 DIAGNOSIS — M4123 Other idiopathic scoliosis, cervicothoracic region: Secondary | ICD-10-CM

## 2016-09-15 NOTE — Therapy (Addendum)
[facility] [place] [address]  [place], [place], [address] Phone: [phone]   Fax:  [phone]  Physical Therapy Treatment  Patient Details  Name: Joshua [name] MRN: [id number] Date of Birth: 05-06-1995 Referring Provider: Atilano Ina  Encounter Date: 09/15/2016      PT End of Session - 09/15/16 1404    Visit Number 6   Date for PT Re-Evaluation 09/22/16   PT Start Time 1400   PT Stop Time 1442   PT Time Calculation (min) 42 min   Activity Tolerance Patient tolerated treatment well      Past Medical History:  Diagnosis Date  . Allergic rhinitis     Past Surgical History:  Procedure Laterality Date  . NO PAST SURGERIES      There were no vitals filed for this visit.      Subjective Assessment - 09/15/16 1404    Subjective Pt. doing well however admitting to inconsistent adherence to HEP.     Patient Stated Goals learn exercises to stretch and strengthening.     Currently in Pain? No/denies   Pain Score 0-No pain   Multiple Pain Sites No                         [facility] Adult PT Treatment/Exercise - 09/15/16 1414      Shoulder Exercises: Supine   Protraction Both  2 x 30 sec    Protraction Weight (lbs) 7     Shoulder Exercises: Prone   Extension 15 reps;Both   Theraband Level (Shoulder Extension) --  I's prone on p-ball    Extension Weight (lbs) 2   External Rotation 15 reps   External Rotation Weight (lbs) 2   External Rotation Limitations "w's" prone on p-ball    Horizontal ABduction 1 15 reps;Both   Horizontal ABduction 1 Weight (lbs) 2   Horizontal ABduction 1 Limitations "T's"; on green p-ball    Horizontal ABduction 2 15 reps;Both   Horizontal ABduction 2 Weight (lbs) 2   Horizontal ABduction 2 Limitations "Y's"; on green p-ball      Shoulder Exercises: Standing   Horizontal ABduction 15 reps;Theraband   Theraband Level (Shoulder Horizontal ABduction) Level 1  (Yellow)   Horizontal ABduction Limitations tc for scap. squeeze   External Rotation Both;15 reps;Theraband   Theraband Level (Shoulder External Rotation) Level 1 (Yellow)   External Rotation Limitations with scapular squeeze    Other Standing Exercises Serratus wall slide on green P-ball with green TB around elbows x 10 reps     Shoulder Exercises: ROM/Strengthening   UBE (Upper Arm Bike) UBE: lvl 3.5, 3 min each way   Cybex Row 10 reps   Cybex Row Limitations 20# 2 x 10 reps                   PT Short Term Goals - 08/17/16 0954      PT SHORT TERM GOAL #1   Title pt will be ind with intial HEP   Time 2   Period Weeks   Status On-going           PT Long Term Goals - 09/12/16 1319      PT LONG TERM GOAL #1   Title pt will be ind with final HEP to help with continued relief of R shoulder/scapular/and thoracic stiffness   Time 6   Period Weeks   Status On-going     PT  LONG TERM GOAL #2   Title pt will improve R shoulder supine off of the table distance to 6in (currently 8in)   Time 6   Period Weeks   Status On-going     PT LONG TERM GOAL #3   Title pt will improve cervical flexion AROM to full without pain   Time 6   Period Weeks   Status On-going               Plan - 09/15/16 1406    Clinical Impression Statement Pt. doing well today however admitting to inconsistent HEP adherence.  Pt. instructed on importance of consistent HEP adherence for full benefit from therapy.   Lower and mid trapezius strengthening activities progressed today without issue.  Prone I's, T's, Y's, and W's progressed to 2#.  Pt. tolerated treatment well and notes he only has R scapular pain at end of day now.   PT Treatment/Interventions Taping;Passive range of motion;Manual techniques;Therapeutic exercise;Neuromuscular re-education;Moist Heat;Electrical Stimulation   PT Next Visit Plan Monitor tolerance to updated HEP; scapular mobility/TE, anterior chest/UT/LS/thoracic  mobility, STM prn, scapular reeducation      Patient will benefit from skilled therapeutic intervention in order to improve the following deficits and impairments:  Pain, Impaired flexibility, Decreased mobility  Visit Diagnosis: Scapular dysfunction  Other idiopathic scoliosis, cervicothoracic region     Problem List Patient Active Problem List   Diagnosis Date Noted  . PCP NOTES >>>>>>>>>>>>>. 01/29/2016  . Annual physical exam 09/02/2014  . Allergic rhinitis   . MIGRAINE HEADACHE 01/14/2008    Bess Harvest, PTA 09/15/16 7:00 PM   PHYSICAL THERAPY DISCHARGE SUMMARY  Visits from Start of Care: 6  Current functional level related to goals / functional outcomes: See above   Remaining deficits: See above   Education / Equipment: HEP  Plan: Patient agrees to discharge.  Patient goals were not met. Patient is being discharged due to not returning since the last visit.  ?????    Patient d/c due to 3 or more no call/no show. Will gladly see patient in the future for any other needs.   Lanney Gins, PT, DPT 10/07/16 8:42 AM   [facility] [address]  [place], [place], [address] Phone: [phone]   Fax:  [phone]  Name: Joshua [name] MRN: [id number] Date of Birth: 11-Dec-1995

## 2016-09-23 ENCOUNTER — Ambulatory Visit: Payer: BLUE CROSS/BLUE SHIELD

## 2016-09-27 ENCOUNTER — Ambulatory Visit: Payer: BLUE CROSS/BLUE SHIELD | Attending: Neurological Surgery | Admitting: Physical Therapy

## 2016-09-30 ENCOUNTER — Ambulatory Visit (INDEPENDENT_AMBULATORY_CARE_PROVIDER_SITE_OTHER): Payer: BLUE CROSS/BLUE SHIELD | Admitting: Psychology

## 2016-09-30 DIAGNOSIS — F331 Major depressive disorder, recurrent, moderate: Secondary | ICD-10-CM

## 2016-10-26 ENCOUNTER — Ambulatory Visit: Payer: Self-pay | Admitting: Psychology

## 2016-11-01 ENCOUNTER — Ambulatory Visit (INDEPENDENT_AMBULATORY_CARE_PROVIDER_SITE_OTHER): Payer: BLUE CROSS/BLUE SHIELD | Admitting: Psychology

## 2016-11-01 DIAGNOSIS — F331 Major depressive disorder, recurrent, moderate: Secondary | ICD-10-CM | POA: Diagnosis not present

## 2016-11-23 ENCOUNTER — Telehealth: Payer: Self-pay | Admitting: Internal Medicine

## 2016-11-23 ENCOUNTER — Ambulatory Visit (INDEPENDENT_AMBULATORY_CARE_PROVIDER_SITE_OTHER): Payer: BLUE CROSS/BLUE SHIELD | Admitting: Psychology

## 2016-11-23 DIAGNOSIS — F331 Major depressive disorder, recurrent, moderate: Secondary | ICD-10-CM | POA: Diagnosis not present

## 2016-11-23 NOTE — Telephone Encounter (Signed)
Immunization reviewed, please advise.

## 2016-11-23 NOTE — Telephone Encounter (Signed)
Relation to JX:BJYNpt:self Call back [phone]   Reason for call:  Patient received a immunization hold from college stating he's immunization are not up to date, patient would like to discuss and speak with nurse directly, please advise

## 2016-11-24 NOTE — Telephone Encounter (Signed)
Spoke w/ Pt, informed of below- he will discuss w/ his university/college first to see which ones he is being held/required on.

## 2016-11-24 NOTE — Telephone Encounter (Signed)
Td was given 2009: Okay to proceed with a booster Had a meningitis shot at age 21, per guidelines does not need a booster. I do recommend 2 doses of BEXERO HPV optional but recommended.

## 2016-12-12 ENCOUNTER — Ambulatory Visit: Payer: BLUE CROSS/BLUE SHIELD | Admitting: Psychology

## 2016-12-20 ENCOUNTER — Encounter: Payer: Self-pay | Admitting: Internal Medicine

## 2016-12-20 ENCOUNTER — Ambulatory Visit (INDEPENDENT_AMBULATORY_CARE_PROVIDER_SITE_OTHER): Payer: BLUE CROSS/BLUE SHIELD | Admitting: Internal Medicine

## 2016-12-20 VITALS — BP 124/70 | HR 98 | Temp 97.4°F | Resp 14 | Ht 74.0 in | Wt 164.2 lb

## 2016-12-20 DIAGNOSIS — J302 Other seasonal allergic rhinitis: Secondary | ICD-10-CM | POA: Diagnosis not present

## 2016-12-20 DIAGNOSIS — F988 Other specified behavioral and emotional disorders with onset usually occurring in childhood and adolescence: Secondary | ICD-10-CM

## 2016-12-20 MED ORDER — AZELASTINE HCL 0.1 % NA SOLN: 2.0000 | Freq: Every evening | NASAL | 3 refills | Status: DC | PRN

## 2016-12-20 NOTE — Patient Instructions (Signed)
  GO TO THE FRONT DESK Schedule your next appointment for a  checkup in 3 months   Use Flonase OTC 2 sprays on the side of the nose daily  Use Astelin, see prescription, 2 sprays on each side of the nose at night

## 2016-12-20 NOTE — Progress Notes (Signed)
Pre visit review using our clinic review tool, if applicable. No additional management support is needed unless otherwise documented below in the visit note. 

## 2016-12-20 NOTE — Progress Notes (Signed)
Subjective:    Patient ID: Joshua [name], male    DOB: 10-15-1995, 21 y.o.   MRN: [id number]  DOS:  12/20/2016 Type of visit - description : Follow-up Interval history: Since the last visit, was seen at [facility], after one visit was prescribed Seroquel, had a panic attack, the patient reports he called back for advise and was dismissed from the practice. Since then, he is actually seeing a counselor, depression is gone, he has occasional anxiety. No suicidal thoughts in the last 3 or 4 months. He still has occasional difficulty sleeping. He is a decrease the use of marijuana. Admits that during the summer he tried LSD   Continue with difficulty concentrating Also had a URI a few weeks ago, still have some residual postnasal dripping and sinus congestion.  Review of Systems Denies fever chills No cough   Past Medical History:  Diagnosis Date  . Allergic rhinitis     Past Surgical History:  Procedure Laterality Date  . NO PAST SURGERIES      Social History   Social History  . Marital status: Single    Spouse name: N/A  . Number of children: N/A  . Years of education: N/A   Occupational History  . Not on file.   Social History Main Topics  . Smoking status: Never Smoker  . Smokeless tobacco: Never Used  . Alcohol use No  . Drug use: No  . Sexual activity: Not on file   Other Topics Concern  . Not on file   Social History Narrative  . No narrative on file      Allergies as of 12/20/2016   No Known Allergies     Medication List       Accurate as of 12/20/16 11:59 PM. Always use your most recent med list.          amoxicillin 875 MG tablet Commonly known as:  AMOXIL Take 1 tablet (875 mg total) by mouth 2 (two) times daily.   azelastine 0.1 % nasal spray Commonly known as:  ASTELIN Place 2 sprays into both nostrils at bedtime as needed for rhinitis. Use in each nostril as directed          Objective:   Physical Exam BP 124/70 (BP Location:  Left Arm, Patient Position: Sitting, Cuff Size: Small)   Pulse 98   Temp (!) 97.4 F (36.3 C) (Oral)   Resp 14   Ht  (1.88 m)   Wt 164 lb 4 oz (74.5 kg)   SpO2 97%   BMI 21.09 kg/m  General:   Well developed, well nourished . NAD.  HEENT:  Normocephalic . Face symmetric, atraumatic. Right tonsil is slightly larger than left. Otherwise throat looks normal. TMs normal, also slightly congested. Sinuses no TTP Lungs:  CTA B Normal respiratory effort, no intercostal retractions, no accessory muscle use. Heart: RRR,  no murmur.  No pretibial edema bilaterally  Skin: Not pale. Not jaundice Neurologic:  alert & oriented X3.  Speech normal, gait appropriate for age and unassisted Psych--  Cognition and judgment appear intact.  Cooperative with normal attention span and concentration.  Behavior appropriate. No anxious or depressed appearing.      Assessment & Plan:   Assessment Allergic rhinitis C. diff 2015  Migraines dx middle school Dx w/ ADD as a child , did not like adderall    Plan:  ADD, anxiety: see  last visit, see history HPI. Saw  psychiatry once, had s/e  from  Seroquel, does not see that psychiatrist anymore. Doing counseling. He is feeling overall better but has persistent ADD sx. . Will refer to another psychiatrist or Dr. Marina Goodell. I'm reluctant to treat ADHD sxs given other comorbidities and sx . Substance use/abuse: Strongly recommend to avoid alcohol and any  other non prescribed psychotropic drugs. Allergic rhinitis: No evidence of infection at this time, recommend Astelin, Flonase. Asymmetric tonsil: Patient reports that is not a new finding, he has been told that many times. RTC 3 months   Today, I spent more than 25   min with the patient: >50% of the time counseling regards Substance use/abuse, explaining why I think he needs to see the specialist for ADD treatment.

## 2016-12-21 NOTE — Assessment & Plan Note (Addendum)
ADD, anxiety: see  last visit, see history HPI. Saw  psychiatry once, had s/e  from Seroquel, does not see that psychiatrist anymore. Doing counseling. He is feeling overall better but has persistent ADD sx. . Will refer to another psychiatrist or Dr. Marina Goodell. I'm reluctant to treat ADHD sxs given other comorbidities and sx. Substance use/abuse: Strongly recommend to avoid alcohol and any  other non prescribed psychotropic drugs. Allergic rhinitis: No evidence of infection at this time, recommend Astelin, Flonase. Asymmetric tonsil: Patient reports that is not a new finding, he has been told that many times. RTC 3 months

## 2016-12-26 ENCOUNTER — Ambulatory Visit: Payer: Self-pay | Admitting: Psychology

## 2017-03-27 ENCOUNTER — Ambulatory Visit: Payer: Self-pay | Admitting: Internal Medicine

## 2017-03-27 DIAGNOSIS — Z0289 Encounter for other administrative examinations: Secondary | ICD-10-CM

## 2017-12-08 ENCOUNTER — Ambulatory Visit (INDEPENDENT_AMBULATORY_CARE_PROVIDER_SITE_OTHER): Payer: BLUE CROSS/BLUE SHIELD | Admitting: Internal Medicine

## 2017-12-08 ENCOUNTER — Encounter: Payer: Self-pay | Admitting: Internal Medicine

## 2017-12-08 VITALS — BP 116/60 | HR 67 | Temp 97.7°F | Resp 16 | Ht 74.0 in | Wt 168.0 lb

## 2017-12-08 DIAGNOSIS — Z23 Encounter for immunization: Secondary | ICD-10-CM | POA: Diagnosis not present

## 2017-12-08 DIAGNOSIS — Z0000 Encounter for general adult medical examination without abnormal findings: Secondary | ICD-10-CM

## 2017-12-08 LAB — LIPID PANEL
Cholesterol: 159 mg/dL (ref <200)
HDL: 52 mg/dL (ref >40)
LDL CHOLESTEROL (CALC): 89 mg/dL
NON-HDL CHOLESTEROL (CALC): 107 mg/dL (ref <130)
TRIGLYCERIDES: 85 mg/dL (ref <150)
Total CHOL/HDL Ratio: 3.1 (calc) (ref <5.0)

## 2017-12-08 LAB — COMPREHENSIVE METABOLIC PANEL
AG RATIO: 2.3 (calc) (ref 1.0–2.5)
ALT: 14 U/L (ref 9–46)
AST: 16 U/L (ref 10–40)
Albumin: 4.6 g/dL (ref 3.6–5.1)
Alkaline phosphatase (APISO): 88 U/L (ref 40–115)
BUN: 8 mg/dL (ref 7–25)
CO2: 28 mmol/L (ref 20–32)
Calcium: 9.5 mg/dL (ref 8.6–10.3)
Chloride: 107 mmol/L (ref 98–110)
Creat: 0.87 mg/dL (ref 0.60–1.35)
GLUCOSE: 92 mg/dL (ref 65–99)
Globulin: 2 g/dL (calc) (ref 1.9–3.7)
Potassium: 4.1 mmol/L (ref 3.5–5.3)
SODIUM: 141 mmol/L (ref 135–146)
TOTAL PROTEIN: 6.6 g/dL (ref 6.1–8.1)
Total Bilirubin: 0.4 mg/dL (ref 0.2–1.2)

## 2017-12-08 MED ORDER — AZITHROMYCIN 250 MG PO TABS: ORAL_TABLET | ORAL | 0 refills | Status: DC

## 2017-12-08 NOTE — Patient Instructions (Addendum)
GO TO THE LAB : Get the blood work     GO TO THE FRONT DESK Schedule your next appointment for a physical exam in 1 year   Come back in 1 month for the next BEXERO  Think about HPV immunization  For cough: Flonase 2 sprays on each side of the nose every day Mucinex DM twice a day anterior better Zithromax for 5 days Call if not gradually better.    Testicular Self-Exam A self-examination of your testicles (testicular self-exam) involves looking at and feeling your testicles for abnormal lumps or swelling. Several things can cause swelling, lumps, or pain in your testicles. Some of these causes are:  Injuries.  Inflammation.  Infection.  Buildup of fluids around your testicle (hydrocele).  Twisted testicles (testicular torsion).  Testicular cancer.  Why is it important to do a testicular self-exam? Self-examination of the testicles and the left and right groin areas may be recommended if you are at risk for testicular cancer. Your groin is where your lower abdomen meets your upper thighs. You may be at risk for testicular cancer if you have:  An undescended testicle (cryptorchidism).  A history of previous testicular cancer.  A family history of testicular cancer.  How to do a testicular self-exam The testicles are easiest to examine after a warm bath or shower. They are more difficult to examine when you are cold. This is because the muscles attached to the testicles retract and pull them up higher or into the abdomen. A normal testicle is egg-shaped and feels firm. It is smooth and not tender. The spermatic cord can be felt as a firm, spaghetti-like cord at the back of your testicle. Look and feel for changes  Stand and hold your penis away from your body.  Look at each testicle to check for lumps or swelling.  Roll each testicle between your thumb and forefinger, feeling the entire testicle. Feel for: ? Lumps. ? Swelling. ? Discomfort.  Check the groin area  between your abdomen and upper thighs on both sides of your body. Look and feel for any swelling or bumps that are tender. These could be enlarged lymph nodes. Contact a health care provider if:  You find any bumps or lumps, such as a small, hard, pea-sized lump.  You find swelling, pain, or soreness.  You see or feel any other changes in your testicles. Summary  A self-examination of your testicles (testicular self-exam) involves looking at and feeling your testicles for any changes.  Self-examination of the testicles and the left and right groin areas may be recommended if you are at risk for testicular cancer.  You should check each of your testicles for lumps, swelling, or discomfort.  You should check for swelling or tender bumps in your groin area between your lower abdomen and upper thighs. This information is not intended to replace advice given to you by your health care provider. Make sure you discuss any questions you have with your health care provider. Document Released: 06/13/2000 Document Revised: 02/01/2016 Document Reviewed: 02/01/2016 Elsevier Interactive Patient Education  2017 [employer]     Safe Sex Practicing safe sex means taking steps before and during sex to reduce your risk of:  Getting an STD (sexually transmitted disease).  Giving your partner an STD.  Unwanted pregnancy.  How can I practice safe sex?  To practice safe sex:  Limit your sexual partners to only one partner who is having sex with only you.  Avoid using alcohol and  recreational drugs before having sex. These substances can affect your judgment.  Before having sex with a new partner: ? Talk to your partner about past partners, past STDs, and drug use. ? You and your partner should be screened for STDs and discuss the results with each other.  Check your body regularly for sores, blisters, rashes, or unusual discharge. If you notice any of these problems, visit your health care  provider.  If you have symptoms of an infection or you are being treated for an STD, avoid sexual contact.  While having sex, use a condom. Make sure to: ? Use a condom every time you have vaginal, oral, or anal sex. Both females and males should wear condoms during oral sex. ? Keep condoms in place from the beginning to the end of sexual activity. ? Use a latex condom, if possible. Latex condoms offer the best protection. ? Use only water-based lubricants or oils to lubricate a condom. Using petroleum-based lubricants or oils will weaken the condom and increase the chance that it will break.  See your health care provider for regular screenings, exams, and tests for STDs.  Talk with your health care provider about the form of birth control (contraception) that is best for you.  Get vaccinated against hepatitis B and human papillomavirus (HPV).  If you are at risk of being infected with HIV (human immunodeficiency virus), talk with your health care provider about taking a prescription medicine to prevent HIV infection. You are considered at risk for HIV if: ? You are a man who has sex with other men. ? You are a heterosexual man or woman who is sexually active with more than one partner. ? You take drugs by injection. ? You are sexually active with a partner who has HIV.  This information is not intended to replace advice given to you by your health care provider. Make sure you discuss any questions you have with your health care provider. Document Released: 04/14/2004 Document Revised: 07/22/2015 Document Reviewed: 01/25/2015 Elsevier Interactive Patient Education  [employer].

## 2017-12-08 NOTE — Assessment & Plan Note (Addendum)
Tdap 11/2017; s/p Meningococcal shot at age ~ [age]; bexero #1 today, RTC for #2 in 1 month Discussed HPV  Patient location: Diet, exercise, risk of alcohol and tobacco abuse.  Risk of drugs, marijuana.  Self testicular exam, safe sex. Labs: CMP, FLP

## 2017-12-08 NOTE — Progress Notes (Signed)
Subjective:    Patient ID: Joshua [name], male    DOB: 1995/07/07, 22 y.o.   MRN: [id number]  DOS:  12/08/2017 Type of visit - description : cpx Interval history: Here for cpx   Review of Systems Reports cough for few weeks, on and off, slightly worse lately. Anterior chest pain after coughing spells. Mucus is also pulling in the throat. Denies hemoptysis. No sneezing or watery eyes. Admits to sinus congestion. No history of asthma or wheezing but recognize that some chest congestion.  Other than above, a 14 point review of systems is negative     Past Medical History:  Diagnosis Date  . Allergic rhinitis     Past Surgical History:  Procedure Laterality Date  . NO PAST SURGERIES      Social History   Socioeconomic History  . Marital status: Single    Spouse name: Not on file  . Number of children: Not on file  . Years of education: Not on file  . Highest education level: Not on file  Occupational History  . Occupation: job: delivery food   . Occupation: student: [employer]   Social Needs  . Financial resource strain: Not on file  . Food insecurity:    Worry: Not on file    Inability: Not on file  . Transportation needs:    Medical: Not on file    Non-medical: Not on file  Tobacco Use  . Smoking status: Current Some Day Smoker  . Smokeless tobacco: Never Used  . Tobacco comment: smokes 3-4 cigarrets qd, no vaping   Substance and Sexual Activity  . Alcohol use: Yes    Comment: "more than I should", no qd   . Drug use: Yes    Types: Marijuana  . Sexual activity: Not on file  Lifestyle  . Physical activity:    Days per week: Not on file    Minutes per session: Not on file  . Stress: Not on file  Relationships  . Social connections:    Talks on phone: Not on file    Gets together: Not on file    Attends religious service: Not on file    Active member of club or organization: Not on file    Attends meetings of clubs or organizations: Not on file   Relationship status: Not on file  . Intimate partner violence:    Fear of current or ex partner: Not on file    Emotionally abused: Not on file    Physically abused: Not on file    Forced sexual activity: Not on file  Other Topics Concern  . Not on file  Social History Narrative   Lives w/ maternal g-parents      Family History  Problem Relation Age of Onset  . Dementia Maternal Grandfather   . Lung cancer Paternal Grandfather   . Diabetes Other   . Stroke Other   . Colon cancer Neg Hx   . Prostate cancer Neg Hx      Allergies as of 12/08/2017   No Known Allergies     Medication List        Accurate as of 12/08/17 11:59 PM. Always use your most recent med list.          azithromycin 250 MG tablet Commonly known as:  ZITHROMAX Take 2 tablets by mouth first day, then 1 tablet for 4 additional days   NYQUIL PO Take by mouth.  Objective:   Physical Exam BP 116/60 (BP Location: Right Arm, Patient Position: Sitting, Cuff Size: Small)   Pulse 67   Temp 97.7 F (36.5 C) (Oral)   Resp 16   Ht 6\' 2"  (1.88 m)   Wt 168 lb (76.2 kg)   SpO2 98%   BMI 21.57 kg/m  General: Well developed, NAD, see BMI.  Neck: No  thyromegaly  HEENT:  Normocephalic . Face symmetric, atraumatic. TMs normal, throat not red, no discharge, 1 of the tonsils this is slightly larger, not a new finding.  Nose is slightly congested. Lungs:  No wheezing, few rhonchi with cough.  Very mild finding. Normal respiratory effort, no intercostal retractions, no accessory muscle use. Heart: RRR,  no murmur.  No pretibial edema bilaterally  Abdomen:  Not distended, soft, non-tender. No rebound or rigidity.   Skin: Exposed areas without rash. Not pale. Not jaundice Neurologic:  alert & oriented X3.  Speech normal, gait appropriate for age and unassisted Strength symmetric and appropriate for age.  Psych: Cognition and judgment appear intact.  Cooperative with normal attention span and  concentration.  Behavior appropriate. No anxious or depressed appearing.     Assessment & Plan:   Assessment Allergic rhinitis C. diff 2015  Migraines dx middle school Dx w/ ADD as a child , did not like adderall    Plan:  Here for CPX Cough: As described above, he sounds slightly congested, recommend a Z-Pak, Flonase, Mucinex.  Call if no better, see instructions Social: [facility] the [employer].  Has a job.  Lives with his grandparents. RTC 1 year

## 2017-12-08 NOTE — Progress Notes (Signed)
Pre visit review using our clinic review tool, if applicable. No additional management support is needed unless otherwise documented below in the visit note. 

## 2017-12-10 NOTE — Assessment & Plan Note (Signed)
Here for CPX Cough: As described above, he sounds slightly congested, recommend a Z-Pak, Flonase, Mucinex.  Call if no better, see instructions Social: [facility]ends the [employer].  Has a job.  Lives with his grandparents. RTC 1 year

## 2018-06-22 ENCOUNTER — Ambulatory Visit: Payer: Self-pay

## 2018-06-22 NOTE — Telephone Encounter (Signed)
Patient called and says he always has a smoker's cough, but he checked his temperature and it was 100.3. He says he feels fine, was just worried because he lives with his grandparents and asked where he could go for coronavirus testing. I asked is he SOB, he denies. He says he feels so good he could run 2 miles if he wanted to. I advised as long as there is no SOB, testing is not being done. 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 I advised self isolation for 3 days after no fever without medication and 7 days, advised to clean the house surfaces with clorox wipes, self isolate away from his grandparents, care advice given, patient verbalized understanding. Advised I will send this note to the office and someone will call him back on Monday, but if his symptoms worsen, do an e-visit on MyChart or [facility] website for increased fever, no SOB. Advised for SOB and increased fever to go to the ED, he verbalized understanding.   Reason for Disposition . [1] Fever (or feeling feverish) OR cough occurs AND [2] living in area with major community spread AND [3] testing being done for symptoms  Answer Assessment - Initial Assessment Questions 1. CLOSE CONTACT: "Who is the person with the confirmed or suspected COVID-19 infection that you were exposed to?"     No person that you're aware of 2. 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 TYPE of CONTACT: "How much contact was there?" (e.g., sitting next to, live in same house, work in same office, same building)     Deliver to homes, apartments, hotels, motels within a 5 mile radius of the store 4. DURATION of CONTACT: "How long were you in contact  with the COVID-19 patient?" (e.g., a few seconds, passed by person, a few minutes, live with the patient)     Just long enough to deliver and collect money to the customers; sometimes would go up elevators 5. DATE of CONTACT: "When did you have contact with a COVID-19 patient?" (e.g., how many days ago)     Worked last about 3 weeks ago 6. TRAVEL: "Have you traveled out of the country recently?" If so, "When and where?"     * Also ask about out-of-state travel, since the CDC has identified some high risk cities for community spread in the [place].     * Note: Travel becomes less relevant if there is widespread community transmission where the patient lives.     No travel and no exposure 7. COMMUNITY SPREAD: "Are there lots of cases or COVID-19 (community spread) where you live?" (See public health department website, if unsure)   * MAJOR community spread: high number of cases; numbers of cases are increasing; many people hospitalized.   * MINOR community spread: low number of cases; not increasing; few or no people hospitalized     No 8. SYMPTOMS: "Do you have any symptoms?" (e.g., fever, cough, breathing difficulty)     Cough, but smoke (no difference in the cough), fever (100.3) 9. PREGNANCY OR POSTPARTUM: "Is there any chance you are pregnant?" "When was your last menstrual period?" "Did  you deliver in the last 2 weeks?"     N/A 10. HIGH RISK: "Do you have any heart or lung problems? Do you have a weak immune system?" (e.g., CHF, COPD, asthma, HIV positive, chemotherapy, renal failure, diabetes mellitus, sickle cell anemia)      No  Protocols used: CORONAVIRUS (COVID-19) EXPOSURE-A-AH

## 2018-06-25 NOTE — Telephone Encounter (Signed)
LMOM informing Pt to return call.  

## 2018-06-25 NOTE — Telephone Encounter (Signed)
I reviewed the note below, please advised to take all possible measures to prevent passing a infection to his grandparents including distancing (to stay in different rooms in the house); handwashing & clothes changing after work and consistent use of a mask

## 2018-06-25 NOTE — Telephone Encounter (Signed)
FYI

## 2018-10-17 ENCOUNTER — Encounter: Payer: Self-pay | Admitting: Internal Medicine

## 2019-01-06 DIAGNOSIS — S60221A Contusion of right hand, initial encounter: Secondary | ICD-10-CM | POA: Diagnosis not present

## 2019-06-14 ENCOUNTER — Encounter: Payer: Self-pay | Admitting: Internal Medicine

## 2019-06-14 ENCOUNTER — Other Ambulatory Visit: Payer: Self-pay

## 2019-06-14 ENCOUNTER — Telehealth (INDEPENDENT_AMBULATORY_CARE_PROVIDER_SITE_OTHER): Payer: BC Managed Care – PPO | Admitting: Internal Medicine

## 2019-06-14 VITALS — Ht 74.0 in | Wt 180.0 lb

## 2019-06-14 DIAGNOSIS — Z09 Encounter for follow-up examination after completed treatment for conditions other than malignant neoplasm: Secondary | ICD-10-CM

## 2019-06-14 DIAGNOSIS — K529 Noninfective gastroenteritis and colitis, unspecified: Secondary | ICD-10-CM

## 2019-06-14 NOTE — Progress Notes (Signed)
   Subjective:    Patient ID: Joshua [name], male    DOB: 12-27-1995, 24 y.o.   MRN: [id number]  DOS:  06/14/2019 Type of visit - description: Virtual Visit via Video Note  I connected with the above patient  by a video enabled telemedicine application and verified that I am speaking with the correct person using two identifiers.   THIS ENCOUNTER IS A VIRTUAL VISIT DUE TO COVID-19 - PATIENT WAS NOT SEEN IN THE OFFICE. PATIENT HAS CONSENTED TO VIRTUAL VISIT / TELEMEDICINE VISIT   Location of patient: home  Location of provider: office  I discussed the limitations of evaluation and management by telemedicine and the availability of in person appointments. The patient expressed understanding and agreed to proceed.  Acute Symptoms started several months ago, 6 to 8 months. He reports on and off lower abdominal cramps, bowel movements are at least 1 day, stools are loose without blood. Lately has developed some postprandial nausea. The last time he received antibiotics was 03-2018 for a root canal.   Denies weight loss. Has not checked his temperature but does not think that he had fever chills. No heartburn He is a smoker, cough is at baseline. No LUTS Other symptoms include occasional headache and sweats at night.     Review of Systems See above   Past Medical History:  Diagnosis Date  . Allergic rhinitis     Past Surgical History:  Procedure Laterality Date  . NO PAST SURGERIES      Allergies as of 06/14/2019   No Known Allergies     Medication List       Accurate as of June 14, 2019 11:59 PM. If you have any questions, ask your nurse or doctor.        STOP taking these medications   azithromycin 250 MG tablet Commonly known as: ZITHROMAX Stopped by: Willow Ora, MD   NYQUIL PO Stopped by: Willow Ora, MD          Objective:   Physical Exam Ht 6\' 2"  (1.88 m)   Wt 180 lb (81.6 kg)   BMI 23.11 kg/m  This is a virtual video visit, he is alert oriented x3,  does not seem underweight.    Assessment      Assessment Allergic rhinitis C. diff 2015  Migraines dx middle school Dx w/ ADD as a child , did not like adderall    PLAN Diarrhea, abdominal cramps: The patient has a history of C. difficile in 2015, DDx include C. difficile recurrence versus IBD versus others. I asked him to be seen in person, next week.  We will do labs,  GI referral ?.    I discussed the assessment and treatment plan with the patient. The patient was provided an opportunity to ask questions and all were answered. The patient agreed with the plan and demonstrated an understanding of the instructions.   The patient was advised to call back or seek an in-person evaluation if the symptoms worsen or if the condition fails to improve as anticipated.

## 2019-06-14 NOTE — Progress Notes (Signed)
Pre visit review using our clinic review tool, if applicable. No additional management support is needed unless otherwise documented below in the visit note. 

## 2019-06-16 NOTE — Assessment & Plan Note (Signed)
Diarrhea, abdominal cramps: The patient has a history of C. difficile in 2015, DDx include C. difficile recurrence versus IBD versus others. I asked him to be seen in person, next week.  We will do labs,  GI referral ?.

## 2019-06-17 ENCOUNTER — Ambulatory Visit: Payer: BC Managed Care – PPO | Admitting: Internal Medicine

## 2019-06-24 ENCOUNTER — Telehealth: Payer: Self-pay | Admitting: Internal Medicine

## 2019-06-24 ENCOUNTER — Ambulatory Visit: Payer: BC Managed Care – PPO | Admitting: Internal Medicine

## 2019-06-24 DIAGNOSIS — K529 Noninfective gastroenteritis and colitis, unspecified: Secondary | ICD-10-CM

## 2019-06-24 DIAGNOSIS — Z0289 Encounter for other administrative examinations: Secondary | ICD-10-CM

## 2019-06-24 NOTE — Telephone Encounter (Signed)
See last video visit, he reported several months history of diarrhea, missed his appointment with me today, recommend: CMP, CBC, stools for GI pathogens. GI referral DX: Chronic diarrhea  please call the patient and arrange all of the above

## 2019-06-25 NOTE — Telephone Encounter (Signed)
Tried calling Pt- no answer-unable to leave message- voicemail full. Orders placed.

## 2019-06-25 NOTE — Telephone Encounter (Signed)
Tried calling Pt again- no answer. Will send letter to Pt.

## 2019-10-01 ENCOUNTER — Encounter: Payer: Self-pay | Admitting: Internal Medicine

## 2019-11-12 ENCOUNTER — Telehealth (INDEPENDENT_AMBULATORY_CARE_PROVIDER_SITE_OTHER): Payer: BC Managed Care – PPO | Admitting: Internal Medicine

## 2019-11-12 ENCOUNTER — Encounter: Payer: Self-pay | Admitting: Internal Medicine

## 2019-11-12 ENCOUNTER — Other Ambulatory Visit: Payer: Self-pay

## 2019-11-12 VITALS — Ht 74.0 in | Wt 180.0 lb

## 2019-11-12 DIAGNOSIS — J019 Acute sinusitis, unspecified: Secondary | ICD-10-CM

## 2019-11-12 MED ORDER — AZITHROMYCIN 250 MG PO TABS: ORAL_TABLET | ORAL | 0 refills | Status: DC

## 2019-11-12 MED ORDER — AZELASTINE HCL 0.1 % NA SOLN: 2.0000 | Freq: Every evening | NASAL | 3 refills | Status: AC | PRN

## 2019-11-12 NOTE — Progress Notes (Signed)
Subjective:    Patient ID: Joshua [name], male    DOB: 1995-05-03, 24 y.o.   MRN: [id number]  DOS:  11/12/2019 Type of visit - description: Virtual Visit via Video Note  I connected with the above patient  by a video enabled telemedicine application and verified that I am speaking with the correct person using two identifiers.   THIS ENCOUNTER IS A VIRTUAL VISIT DUE TO COVID-19 - PATIENT WAS NOT SEEN IN THE OFFICE. PATIENT HAS CONSENTED TO VIRTUAL VISIT / TELEMEDICINE VISIT   Location of patient: home  Location of provider: office  Persons participating in the virtual visit: patient, provider   I discussed the limitations of evaluation and management by telemedicine and the availability of in person appointments. The patient expressed understanding and agreed to proceed.  Acute 2 days ago he felt the need to blow his nose, he got a large amount of thick yellow discharge. Immediately developed pain around the right eye and right temple. His vision got a slightly blurred but denies diplopia, amaurosis fugax, he did have some floaters.  He is calling because he still have some pain at R sinus area and  nasal congestion. Denies any fever chills Admits to on and off cough for a while with white sputum production. Denies sneezing or sore throat per se     Review of Systems See above   Past Medical History:  Diagnosis Date  . Allergic rhinitis     Past Surgical History:  Procedure Laterality Date  . NO PAST SURGERIES      Allergies as of 11/12/2019   No Known Allergies     Medication List       Accurate as of November 12, 2019  8:53 PM. If you have any questions, ask your nurse or doctor.        azelastine 0.1 % nasal spray Commonly known as: ASTELIN Place 2 sprays into both nostrils at bedtime as needed for rhinitis. Use in each nostril as directed Started by: Willow Ora, MD   azithromycin 250 MG tablet Commonly known as: Zithromax Z-Pak 2 tabs a day the first  day, then 1 tab a day x 4 days Started by: Willow Ora, MD          Objective:   Physical Exam Ht 6\' 2"  (1.88 m)   Wt 180 lb (81.6 kg)   BMI 23.11 kg/m  This is a virtual video visit, he is alert oriented x3.  Face symmetric, EOMI.  Voice is somewhat nasal.    Assessment     Assessment Allergic rhinitis C. diff 2015  Migraines dx middle school Dx w/ ADD as a child , did not like adderall    PLAN Sinusitis: The patient reports nasal congestion and some pain around the right eye as described above. Mild sinusitis, allergic sinusitis?. Plan: Mucinex, Flonase, Astelin. He has a history of C. difficile, okay to take antibiotic only if he is not improving in the next few days.  Zithromax sent. He has some eye symptoms for the last 2 days, strongly recommend to see optometrist.  Unable to do a proper eye exam today   I discussed the assessment and treatment plan with the patient. The patient was provided an opportunity to ask questions and all were answered. The patient agreed with the plan and demonstrated an understanding of the instructions.   The patient was advised to call back or seek an in-person evaluation if the symptoms worsen or if the condition  fails to improve as anticipated.

## 2019-11-12 NOTE — Assessment & Plan Note (Signed)
Sinusitis: The patient reports nasal congestion and some pain around the right eye as described above. Mild sinusitis, allergic sinusitis?. Plan: Mucinex, Flonase, Astelin. He has a history of C. difficile, okay to take antibiotic only if he is not improving in the next few days.  Zithromax sent. He has some eye symptoms for the last 2 days, strongly recommend to see optometrist.  Unable to do a proper eye exam today

## 2019-11-12 NOTE — Progress Notes (Signed)
Pre visit review using our clinic review tool, if applicable. No additional management support is needed unless otherwise documented below in the visit note. 

## 2019-12-03 ENCOUNTER — Other Ambulatory Visit: Payer: Self-pay

## 2019-12-03 ENCOUNTER — Telehealth: Payer: BC Managed Care – PPO | Admitting: Internal Medicine

## 2020-05-29 ENCOUNTER — Encounter: Payer: Self-pay | Admitting: Internal Medicine

## 2020-06-23 ENCOUNTER — Telehealth: Payer: Self-pay | Admitting: Internal Medicine

## 2020-06-23 NOTE — Telephone Encounter (Signed)
Patient is requesting for a TOC to [facility] office Due to it being closer to his home. Please Advise Approval

## 2020-06-23 NOTE — Telephone Encounter (Signed)
That is okay, thank you 

## 2020-06-24 NOTE — Telephone Encounter (Signed)
Okay per [facility] for [facility]

## 2020-06-26 NOTE — Telephone Encounter (Signed)
lvm for pt to call and schedule TOC to provider of choice 

## 2020-07-13 NOTE — Telephone Encounter (Signed)
lvm for pt to call and schedule TOC to provider of choice

## 2021-01-27 DIAGNOSIS — S61409A Unspecified open wound of unspecified hand, initial encounter: Secondary | ICD-10-CM | POA: Diagnosis not present

## 2021-02-19 ENCOUNTER — Ambulatory Visit: Payer: BC Managed Care – PPO | Admitting: Family Medicine

## 2021-05-21 ENCOUNTER — Encounter: Payer: Self-pay | Admitting: Internal Medicine

## 2021-09-07 ENCOUNTER — Ambulatory Visit: Payer: BC Managed Care – PPO | Admitting: Internal Medicine

## 2022-03-07 ENCOUNTER — Encounter: Payer: Self-pay | Admitting: Family Medicine

## 2022-03-07 ENCOUNTER — Ambulatory Visit (INDEPENDENT_AMBULATORY_CARE_PROVIDER_SITE_OTHER): Payer: BC Managed Care – PPO | Admitting: Family Medicine

## 2022-03-07 VITALS — BP 136/84 | HR 69 | Temp 97.8°F | Ht 73.5 in

## 2022-03-07 DIAGNOSIS — R45851 Suicidal ideations: Secondary | ICD-10-CM

## 2022-03-07 DIAGNOSIS — F1290 Cannabis use, unspecified, uncomplicated: Secondary | ICD-10-CM

## 2022-03-07 DIAGNOSIS — F1020 Alcohol dependence, uncomplicated: Secondary | ICD-10-CM

## 2022-03-07 DIAGNOSIS — F909 Attention-deficit hyperactivity disorder, unspecified type: Secondary | ICD-10-CM

## 2022-03-07 DIAGNOSIS — F329 Major depressive disorder, single episode, unspecified: Secondary | ICD-10-CM

## 2022-03-07 DIAGNOSIS — J329 Chronic sinusitis, unspecified: Secondary | ICD-10-CM

## 2022-03-07 DIAGNOSIS — Z8619 Personal history of other infectious and parasitic diseases: Secondary | ICD-10-CM | POA: Diagnosis not present

## 2022-03-07 DIAGNOSIS — F411 Generalized anxiety disorder: Secondary | ICD-10-CM

## 2022-03-07 MED ORDER — FLUTICASONE PROPIONATE 50 MCG/ACT NA SUSP: 2.0000 | Freq: Every day | NASAL | 6 refills | Status: AC

## 2022-03-07 MED ORDER — ESCITALOPRAM OXALATE 10 MG PO TABS: 5.0000 mg | ORAL_TABLET | Freq: Every day | ORAL | 0 refills | Status: AC

## 2022-03-07 NOTE — Progress Notes (Unsigned)
Assessment/Plan:   Problem List Items Addressed This Visit   None Visit Diagnoses     History of Clostridioides difficile infection    -  Primary   Major depressive disorder with current active episode, unspecified depression episode severity, unspecified whether recurrent       Relevant Medications   escitalopram (LEXAPRO) 10 MG tablet   Passive suicidal ideations       Relevant Medications   escitalopram (LEXAPRO) 10 MG tablet   Alcohol use disorder, moderate, dependence (HCC)       Attention deficit hyperactivity disorder (ADHD), unspecified ADHD type       Relevant Orders   Ambulatory referral to Psychiatry   Recurrent sinusitis       Relevant Medications   fluticasone (FLONASE) 50 MCG/ACT nasal spray   GAD (generalized anxiety disorder)       Relevant Medications   escitalopram (LEXAPRO) 10 MG tablet          Subjective:  HPI:  Joshua [name] is a 26 y.o. male who has MIGRAINE HEADACHE; Allergic rhinitis; Annual physical exam; and PCP NOTES >>>>>>>>>>>>>. on their problem list..   He  has a past medical history of Allergic rhinitis.Joshua [name]   He presents with chief complaint of Establish Care (Chronic migraines on right side of head. Has had numerous sinus infections over the past few months. Discuss ADHD was dx as a child. Now has issues staying on task. Quitting drinking assistance) .     Patient is here to establish care.  Joshua [name] is from [place].  He works for [employer].   He reports that his God Father died today of Brain Aneurysm.  This is caused him to be sad.  Alcohol Use Disorder.  Patient is interested in quitting drinks.  He drinks about 40 oz of 9.5% ABV (closely equivalent 8 beers a day).  He states that his alcohol use worsened with the pandemic, but is now worsening amount.  Patient states that if he goes a few days without drinking he will get moderate shakes.  Recurrent Sinus Infection.  Patient reports history of sinusitis since childhood.  They  are associated with nasal drainage and headache.  They tend to occur on the right side with pain that radiates to right ear, eye, and even on right upper jaw.  He reports that he has been on multiple course of antibiotics and eventually developed C. difficile.  He reports they occur 1-2 times a month.  He does not have a current infection.  He typically treats it with Tylenol and Benadryl.  Had a lot of Sinus infections, developed C diff. Not having on now, gets 1-2 a month, improved with tylenol and benadryl.  ADHD.  He reports that he was diagnosed as a child.  He has been on Adderall in the past, but was discontinued because of side effects.  Patient reports that he was in school in 2021, but failed coursework due to his ability to focus.  Depression and Anxiety.  Patient reported longstanding issue.  He has seen psychiatry in the past and trialed Seroquel x 1.  But discontinued this due to the way it made him feel.  He has been in therapy in the past but is not currently.  Patient reports passive suicidal ideation, this is worse with the recent death of his grandfather.  He denies any active plan or access to any lethal means.  He does report history of "cutting and cigarette burning" when he was young,  but denies any previous suicidal attempt or hospitalization for suicide attempt.  Denies HI.     03/07/2022    4:45 PM  GAD 7 : Generalized Anxiety Score  Nervous, Anxious, on Edge 3  Control/stop worrying 2  Worry too much - different things 2  Trouble relaxing 2  Restless 2  Easily annoyed or irritable 3  Afraid - awful might happen 3  Total GAD 7 Score 17  Anxiety Difficulty Very difficult      03/07/2022    4:44 PM 06/14/2019    9:28 AM 12/08/2017    3:53 PM 01/28/2016   12:01 PM  Depression screen PHQ 2/9  Decreased Interest 2 0 0 0  Down, Depressed, Hopeless 1 0 0 0  PHQ - 2 Score 3 0 0 0  Altered sleeping 3     Tired, decreased energy 3     Change in appetite 3     Feeling bad  or failure about yourself  2     Trouble concentrating 3     Moving slowly or fidgety/restless 2     Suicidal thoughts 1     PHQ-9 Score 20     Difficult doing work/chores Very difficult       Substance use.  In addition to alcohol, patient uses marijuana, tobacco, and has used psilocybin mushrooms in the past.    Past Surgical History:  Procedure Laterality Date   NO PAST SURGERIES      Outpatient Medications Prior to Visit  Medication Sig Dispense Refill   azelastine (ASTELIN) 0.1 % nasal spray Place 2 sprays into both nostrils at bedtime as needed for rhinitis. Use in each nostril as directed 30 mL 3   azithromycin (ZITHROMAX Z-PAK) 250 MG tablet 2 tabs a day the first day, then 1 tab a day x 4 days 6 tablet 0   No facility-administered medications prior to visit.    Family History  Problem Relation Age of Onset   Dementia Maternal Grandfather    Lung cancer Paternal Grandfather    Diabetes Other    Stroke Other    Colon cancer Neg Hx    Prostate cancer Neg Hx     Social History   Socioeconomic History   Marital status: Single    Spouse name: Not on file   Number of children: Not on file   Years of education: Not on file   Highest education level: Not on file  Occupational History   Occupation: job: delivery food    Occupation: student: [employer]   Tobacco Use   Smoking status: Some Days    Passive exposure: Never   Smokeless tobacco: Never   Tobacco comments:    smokes 3-4 cigarrets qd, no vaping   Vaping Use   Vaping Use: Every day  Substance and Sexual Activity   Alcohol use: Yes    Comment: "more than I should", no qd  16 days a week   Drug use: Yes    Types: Marijuana   Sexual activity: Not on file  Other Topics Concern   Not on file  Social History Narrative   Lives w/ maternal g-parents    Social Determinants of Health   Financial Resource Strain: Not on file  Food Insecurity: Not on file  Transportation Needs: Not on file  Physical Activity:  Not on file  Stress: Not on file  Social Connections: Not on file  Intimate Partner Violence: Not on file  Objective:  Physical Exam: BP 136/84 (BP Location: Left Arm, Patient Position: Sitting, Cuff Size: Large)   Pulse 69   Temp 97.8 F (36.6 C) (Temporal)   Ht 6' 1.5" (1.867 m)   SpO2 100%   BMI 23.43 kg/m    ***General: No acute distress. Awake and conversant.  Eyes: Normal conjunctiva, anicteric. Round symmetric pupils.  ENT: Hearing grossly intact. No nasal discharge.  Neck: Neck is supple. No masses or thyromegaly.  Respiratory: Respirations are non-labored. No auditory wheezing.  Skin: Warm. No rashes or ulcers.  Psych: Alert and oriented. Cooperative, Appropriate mood and affect, Normal judgment.  CV: No cyanosis or JVD MSK: Normal ambulation. No clubbing  Neuro: Sensation and CN II-XII grossly normal.        Garner Nash, MD, MS

## 2022-03-07 NOTE — Patient Instructions (Signed)
For anxiety and depression, start escitalopram.  For ADHD, we are referring to counseling specialist.  For recurrent sinus infections, try Flonase.  For alcohol use, consider going to Alcoholics Anonymous.  For urgent psychiatric assistance you can go to   [facility] Phone: [phone]  [address] [place], [place] [address]  Hours: Open 24/7, No appointment required.  [facility]  [address] [place], [place] [address] Phone: [phone]  When you're going through tough times, it's easy to feel lonely and overwhelmed. Remember that YOU ARE NOT ALONE and we at [facility] want to support you during this difficult time.  There are many ways to get help and support when you are ready.  You can call the following help lines: - National suicide hotline ((304) 884-3031) - National Hopeline Network (1-800-SUICIDE)   You can schedule an appointment with a team member with your primary care doc or one of our counselors.  We are all here to support you.  A doctor is on call 24/7   There are many treatments that can help people during difficult times, and we can talk with you about medications, counseling, diet, and other options. We want to offer you HOPE that it won't always feel this bad.   If you are seriously thinking about hurting yourself or have a plan, please call 911 or go to any emergency room right away for immediate help.

## 2022-03-09 ENCOUNTER — Encounter: Payer: Self-pay | Admitting: Family Medicine

## 2022-03-11 DIAGNOSIS — J029 Acute pharyngitis, unspecified: Secondary | ICD-10-CM | POA: Diagnosis not present

## 2022-03-11 DIAGNOSIS — R051 Acute cough: Secondary | ICD-10-CM | POA: Diagnosis not present

## 2022-03-11 DIAGNOSIS — R0600 Dyspnea, unspecified: Secondary | ICD-10-CM | POA: Diagnosis not present

## 2022-03-11 DIAGNOSIS — R0981 Nasal congestion: Secondary | ICD-10-CM | POA: Diagnosis not present

## 2022-03-13 DIAGNOSIS — R45851 Suicidal ideations: Secondary | ICD-10-CM | POA: Insufficient documentation

## 2022-03-13 DIAGNOSIS — F1020 Alcohol dependence, uncomplicated: Secondary | ICD-10-CM | POA: Insufficient documentation

## 2022-03-13 DIAGNOSIS — Z8619 Personal history of other infectious and parasitic diseases: Secondary | ICD-10-CM | POA: Insufficient documentation

## 2022-03-13 DIAGNOSIS — F329 Major depressive disorder, single episode, unspecified: Secondary | ICD-10-CM | POA: Insufficient documentation

## 2022-03-13 DIAGNOSIS — F909 Attention-deficit hyperactivity disorder, unspecified type: Secondary | ICD-10-CM | POA: Insufficient documentation

## 2022-03-13 DIAGNOSIS — F411 Generalized anxiety disorder: Secondary | ICD-10-CM | POA: Insufficient documentation

## 2022-03-13 NOTE — Assessment & Plan Note (Signed)
Recurrent Sinusitis: Given the frequency and severity of sinusitis, it is advised to stop Azithromycin and Azelastine, and commence Fluticasone 35mcg/act nasal spray. Referral to ENT suggested if no improvement occurs.

## 2022-03-13 NOTE — Assessment & Plan Note (Signed)
  Given no active plan,, felt to be low risk, however community resources, access to care for urgent need was also discussed, were provided and we will continue monitor closely

## 2022-03-13 NOTE — Assessment & Plan Note (Signed)
Attention Deficit Hyperactivity Disorder (ADHD), unspecified ADHD type: Considering the complexity of the patient's psychiatric condition and history of substance abuse, controlled ADHD medications like amphetamines are not recommended at this time.

## 2022-03-13 NOTE — Assessment & Plan Note (Signed)
Alcohol Use Disorder, Moderate, Dependence: Patient shows interest in quitting alcohol. Due to his dependance, he has been advised to gradually minimize his alcohol intake rather than abruptly stopping to avoid harmful withdrawal symptoms.

## 2022-03-13 NOTE — Assessment & Plan Note (Signed)
Given the interconnectedness between his depression and anxiety, it is suggested to start Escitalopram (Lexapro) 10mg  tablet. This medication may help control both mood issues.

## 2022-03-13 NOTE — Assessment & Plan Note (Signed)
Major depressive disorder with active current episode, unspecified depression episode severity, unspecified whether recurrent: The patient's current psychiatric state is severe and is exacerbated by his alcohol and substance use. The depression has also likely become worse with recent significant personal loss. His treatment plan includes a referral to psychiatry and commencement of Escitalopram 10mg  once daily.

## 2022-04-04 ENCOUNTER — Ambulatory Visit: Payer: BC Managed Care – PPO | Admitting: Family Medicine

## 2022-04-12 ENCOUNTER — Telehealth (HOSPITAL_COMMUNITY): Payer: Self-pay | Admitting: *Deleted

## 2022-04-12 ENCOUNTER — Telehealth (HOSPITAL_COMMUNITY): Payer: Self-pay

## 2022-04-12 ENCOUNTER — Telehealth (HOSPITAL_COMMUNITY): Payer: Self-pay | Admitting: Licensed Clinical Social Worker

## 2022-04-12 NOTE — Telephone Encounter (Signed)
Medication management - Telephone call with patient to follow up on his reported concerns he felt like one of the staff members at our office had spoken to him in a derogatory manner. Patient stated that earlier today he had left a message wanting to know more about our services but then he was awaken by Joshua [name], therapist who he stated upset him asking about his current condition and if he was suicidal or not.  Patient admitted he had called and left several messages at the [facility] office this evening after speaking with therapist, that he was was threatening to "come up there and show out".  Patient stated he was sorry for this but felt he was trying to reach out for advice and some help and felt the therapist was unprofessional in the way she handled their call and felt she had overstepped when he claimed she questioned if he was "playing games".  Patient admitted this set him off and he did not want to do anything just to get someone to speak to him and to hear his concerns. Attempted to explain to patient collateral was in a way doing her job to make sure he was not in any immediate danger and patient stated understanding this but because of past history did not feel comfortable answering if he was suicidal or not as he had been hospitalized for answering this in the past.  Continued to talk with patient about his history and explained our different services that we could offer if he would like to try seeing a provider or a therapist.  Patient more receptive as the conversation continued and agreed to call him back on tomorrow with therapist, as patient requested she just apologize for the way the conversation went. Questioned if patient was having suicidal thoughts as this nurse explained I would like to get him some help if needed?  Patient stated "I always have suicidal thoughts now and then, but I am not actively wanting to hurt myself right now".  Patient denied any plan, intent, or means to want to  harm self at this time and agreed to follow up with him with a call tomorrow.  Patient to consider possible arrangements to get him seen by one of our outpatient providers or a therapist and will discuss more on tomorrow. Patient stable and calm at this time and again apologized for earlier statements.  Patient also expressed thankfulness for this nurse manage calling him back tonight and listening to his concerns.  Patient denied any need to come in at this time or to have police come out to see him this evening and agreed to call 988, the national suicide hotline, or our emergency services or [facility] or to come in if anything changed.  Will call patient 04/13/22 to follow up.  Called and spoke to therapist after speaking with patient.  Collateral admitted she may not have handled the call as best as she could have and did apologize.  Patient would like to call with this nurse manager on tomorrow to patient to apologize and to make sure he understands we are there to offer help if he would like services as he is not currently reported a danger to self or others after approximately a 20 minute conversation.

## 2022-04-12 NOTE — Telephone Encounter (Signed)
Patient called and left two voice mails regarding his interaction with [place], and is wanting someone to call his ASAP regarding this. During call patient was offered services but declined all due the conversation he ha. Patient states that he only wants a verbal apology from LCSW he spoke wit. At the end of call patient seemed to be calm, but still states that if no one gave him a call back he will show up here tomorrow and be a menace if he does not get a call regarding this matter. Nothing was further needed at the end of call

## 2022-04-12 NOTE — Telephone Encounter (Signed)
Cln returned pt's call and pt asked "Can we delay the call?" Cln asked for clarification if pt meant he is requesting cln call back later. Pt stated yes and asked cln to call tomorrow between 2 pm and 3 pm. Cln asked pt if he is experiencing SI and is safe, to which pt responded "Sure." Cln expressed concern due to the evasive nature of pt's answer coupled with the tearful message left on PHP vm and informed pt of this concern. Cln explained that this cln works through [employer] and [facility] and can connect pt with services regardless of insurance coverage and informed him that he would be covered under [employer] as a [employer]. Pt made a statement regarding how nothing is ever free. Cln asked again about SI and pt stated, "I don't like that you're asking me this question. People have asked me that before and it caused a kerfuffle." Cln asked if by "kerfuffle" pt meant that he was hospitalized, to which he responded, "They tried. But I'm slippery." Cln reminded pt of tearful voicemail and expressed a desire to help connect pt with services and answer questions, to which he responded, "That was a lapse in judgement." Cln asked about SI for a third time due to pt not answering directly and he responded "I already told you. I appreciate what you do, but you woke me up and you keep asking me all of these questions." Cln asked pt if he was high due to vague and odd answers and informed pt that he has not answered the question directly and asked a fourth time if he is currently experiencing suicidal ideations with a plan or intent to act on them and pt responded, "No. You woke me up out of my sleep and now you can guarantee I won't ever call you again." Cln reminded pt that he called and left a voicemail in which he tearfully asked for help and cln returned the call, per his request. Both cln and pt expressed frustration and the call was disconnected. Cln sent message in MyChart providing crisis and  outpatient resources.

## 2022-04-12 NOTE — Telephone Encounter (Signed)
Patient called left a very angry threatening voicemail. States that a staff member called and woke him up from his sleep and told him he was playing games. Demanding the staff members name and number. Cursing and states "I want that Bitch's name and I want her on the [facility] phone." "I want her name and that bitch's ass strung up on a fucking billboard." "I will be there when you open if you do not call me back and give me her name."  Felicia at the front desk returned call to address patients concerns and find out the type of services he is in need of. Notified staff member Moses Manners that he spoke with and was making threats to. Let her listen to the voicemail that was left. Message saved in voicemail. Patient was able to be calm and apologize during call back.

## 2022-04-14 NOTE — Telephone Encounter (Signed)
Advice - Follow up telephone call with patient with Joshua [name], therapist at approxmately 3:20 pm 04/13/22 to allow collateral to apologize for their less than exceptional experience with a phone conversation 04/12/22. Patient stated understanding that the call got a little heated and that collateral was attempting to just make sure he was safe.  Collateral was also appreciative of therapist calling back to offer a personal apology and also offered if patient would like to set up a time to see a therapist or a provider at our [facility] office we would be glad to assist with this as well.  Patient stated he was still considering this but did not feel was needed at this time.  Patient clear with discussion and appreciative of  efforts to assist him if needed but denied this need at present.  Patient agreed to call back if this changed.

## 2022-04-26 ENCOUNTER — Ambulatory Visit: Payer: BC Managed Care – PPO | Admitting: Family Medicine

## 2023-10-24 ENCOUNTER — Encounter: Admitting: Family Medicine

## 2023-10-30 ENCOUNTER — Ambulatory Visit: Admitting: Emergency Medicine

## 2023-10-30 ENCOUNTER — Ambulatory Visit: Payer: Self-pay

## 2023-10-30 VITALS — BP 126/74 | HR 105 | Temp 97.6°F | Resp 16 | Ht 73.5 in | Wt 206.0 lb

## 2023-10-30 DIAGNOSIS — F19939 Other psychoactive substance use, unspecified with withdrawal, unspecified: Secondary | ICD-10-CM | POA: Diagnosis not present

## 2023-10-30 DIAGNOSIS — R1010 Upper abdominal pain, unspecified: Secondary | ICD-10-CM | POA: Diagnosis not present

## 2023-10-30 MED ORDER — METHOCARBAMOL 500 MG PO TABS
500.0000 mg | ORAL_TABLET | Freq: Three times a day (TID) | ORAL | 0 refills | Status: AC | PRN
Start: 2023-10-30 — End: ?

## 2023-10-30 MED ORDER — HYDROXYZINE PAMOATE 25 MG PO CAPS: 25.0000 mg | ORAL_CAPSULE | Freq: Three times a day (TID) | ORAL | 0 refills | Status: AC | PRN

## 2023-10-30 MED ORDER — ONDANSETRON 4 MG PO TBDP: 4.0000 mg | ORAL_TABLET | Freq: Three times a day (TID) | ORAL | 0 refills | Status: DC | PRN

## 2023-10-30 MED ORDER — DICYCLOMINE HCL 20 MG PO TABS: 20.0000 mg | ORAL_TABLET | Freq: Four times a day (QID) | ORAL | 0 refills | Status: AC | PRN

## 2023-10-30 NOTE — Progress Notes (Signed)
 Subjective:  Muscle Pain (Muscle pain, stomach pain, diarrhea, chills since this morning. Had been using 240-280 mg of 17 OH daily. Hasn't had it since yesterday. )    HPI: Joshua [name] is a 28 y.o. male presenting on 10/30/2023 with report of withdrawal symptoms Started using 17-OH (kratom) 01/2023, steady dose increase since then and becoming cost-prohibitive so he stopped yesterday. Since then, reports quite bothersome withdrawal symptoms.  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   Relevant past medical history reviewed and updated as indicated.   Allergies and medications reviewed and updated.   Current Outpatient Medications:    Ascorbic Acid (VITAMIN C) 100 MG tablet, Take 100 mg by mouth daily., Disp: , Rfl:    dicyclomine  (BENTYL ) 20 MG tablet, Take 1 tablet (20 mg total) by mouth every 6 (six) hours as needed for spasms (for abdominal cramping)., Disp: 20 tablet, Rfl: 0   hydrOXYzine  (VISTARIL ) 25 MG capsule, Take 1 capsule (25 mg total) by mouth every 8 (eight) hours as needed for anxiety (can increase to 2 capsules at bedtime if needed)., Disp: 30 capsule, Rfl: 0   methocarbamol  (ROBAXIN ) 500 MG tablet, Take 1-2 tablets (500-1,000 mg total) by mouth every 8 (eight) hours as needed for muscle spasms (for muscle pain)., Disp: 32 tablet, Rfl: 0   ondansetron  (ZOFRAN -ODT) 4 MG disintegrating tablet, Take 1-2 tablets (4-8 mg total) by mouth every 8 (eight) hours as needed for nausea or vomiting., Disp: 20 tablet, Rfl: 0   azelastine  (ASTELIN ) 0.1 % nasal spray, Place 2 sprays into both nostrils at bedtime as needed for rhinitis. Use in each nostril as directed (Patient not taking: Reported on 10/30/2023), Disp: 30 mL, Rfl: 3   escitalopram  (LEXAPRO ) 10 MG tablet, Take 0.5 tablets (5  mg total) by mouth daily. (Patient not taking: Reported on 10/30/2023), Disp: 15 tablet, Rfl: 0   fluticasone  (FLONASE ) 50 MCG/ACT nasal spray, Place 2 sprays into both nostrils daily. (Patient not taking: Reported on 10/30/2023), Disp: 16 g, Rfl: 6  Allergies  Allergen Reactions   Hydrocodone     Hallucinations    Objective:   BP 126/74   Pulse (!) 105   Temp 97.6 F (36.4 C)   Resp 16   Ht 6' 1.5 (1.867 m)   Wt 206 lb (93.4 kg)   SpO2 99%   BMI 26.81 kg/m    Physical Exam Vitals and nursing note reviewed.  Constitutional:      General: He is in acute distress (appears anxious, uncomfortable).     Appearance: He is not toxic-appearing or diaphoretic.  HENT:     Head: Normocephalic and atraumatic.     Nose: Nose normal.     Mouth/Throat:     Mouth: Mucous membranes are moist.  Eyes:     General: No scleral icterus.    Conjunctiva/sclera: Conjunctivae normal.     Pupils: Pupils are equal, round, and reactive to light.  Cardiovascular:     Rate and Rhythm: Normal rate and regular rhythm.  Pulmonary:     Effort: Pulmonary effort is normal. No respiratory distress.     Breath sounds: Normal breath sounds.  Abdominal:     General: Bowel sounds are normal.     Palpations: Abdomen is soft.     Tenderness: There is no abdominal  tenderness.  Musculoskeletal:     Cervical back: Neck supple.  Lymphadenopathy:     Cervical: No cervical adenopathy.  Skin:    General: Skin is warm and dry.     Coloration: Skin is not jaundiced.     Findings: No rash.  Neurological:     Mental Status: He is alert and oriented to person, place, and time.     Coordination: Coordination normal.     Gait: Gait normal.  Psychiatric:        Thought Content: Thought content normal.        Judgment: Judgment normal.       Assessment & Plan:  1. Recurrent upper abdominal pain (Primary) Currently benign.has some concern re: liver issues from prior ETOH, also with upper abd pain with kratom  use at high doses. Will check today. No jaundice or scleral icterus on exam.  - Comprehensive metabolic panel with GFR  2. Withdrawal from other psychoactive substance (HCC) Supportive care reviewed Recheck precautions reviewed - hydrOXYzine  (VISTARIL ) 25 MG capsule; Take 1 capsule (25 mg total) by mouth every 8 (eight) hours as needed for anxiety (can increase to 2 capsules at bedtime if needed).  Dispense: 30 capsule; Refill: 0 - ondansetron  (ZOFRAN -ODT) 4 MG disintegrating tablet; Take 1-2 tablets (4-8 mg total) by mouth every 8 (eight) hours as needed for nausea or vomiting.  Dispense: 20 tablet; Refill: 0 - dicyclomine  (BENTYL ) 20 MG tablet; Take 1 tablet (20 mg total) by mouth every 6 (six) hours as needed for spasms (for abdominal cramping).  Dispense: 20 tablet; Refill: 0 - methocarbamol  (ROBAXIN ) 500 MG tablet; Take 1-2 tablets (500-1,000 mg total) by mouth every 8 (eight) hours as needed for muscle spasms (for muscle pain).  Dispense: 32 tablet; Refill: 0    Follow up plan: Return if symptoms worsen or fail to improve.  Corean Geralds, MSPAS, PA-C

## 2023-10-30 NOTE — Telephone Encounter (Signed)
 FYI Only or Action Required?: FYI only for provider.  Patient was last seen in primary care on 03/07/2022 by Sebastian Beverley NOVAK, MD.  Called Nurse Triage reporting withdrawl symptoms.  Symptoms began yesterday.  Interventions attempted: Nothing.  Symptoms are: gradually worsening.  Triage Disposition: See HCP Within 4 Hours (Or PCP Triage)  Patient/caregiver understands and will follow disposition?: Yes          Copied from CRM #[id number]. Topic: Clinical - Red Word Triage >> Oct 30, 2023  1:06 PM Joshua [name] wrote: Red Word that prompted transfer to Nurse Triage: Opiod withdrawals, looking for some assistance/inquiry for OTC medication. Patient has been using 7-OH. Reason for Disposition  [1] SEVERE pain (e.g., excruciating, unable to do any normal activities) AND [2] not improved after 2 hours of pain medicine  Answer Assessment - Initial Assessment Questions 1. ONSET: When did the pain start?      Was taking 7-OH; now having leg pain, back pain, n/v/[name] 2. LOCATION: Where is the pain located?      Legs back 3. PAIN: How bad is the pain?    (Scale 1-10; or mild, moderate, severe)     mild 4. WORK OR EXERCISE: Has there been any recent work or exercise that involved this part of the body?      Was on opioids  5. CAUSE: What do you think is causing the leg pain?     Withdrawal symptoms 6. OTHER SYMPTOMS: Do you have any other symptoms? (e.g., chest pain, back pain, breathing difficulty, swelling, rash, fever, numbness, weakness)     denies 7. PREGNANCY: Is there any chance you are pregnant? When was your last menstrual period?     na  Protocols used: Leg Pain-A-AH

## 2023-10-30 NOTE — Patient Instructions (Signed)
 Ondansetron  for nausea Dicyclomine  for stomach cramps Hydroxyzine  for restless/anxious symptoms Methocarbamol  for muscle aches/pains Acetaminophen for pain. Ok to use ibuprofen  instead if your stomach tolerates it Focus on hydration, including electrolytes We will let you know bloodwork result when it comes back and call if there is any cause for concern

## 2023-10-31 ENCOUNTER — Ambulatory Visit: Payer: Self-pay | Admitting: Emergency Medicine

## 2023-10-31 LAB — COMPREHENSIVE METABOLIC PANEL WITH GFR
ALT: 25 U/L (ref 0–53)
AST: 18 U/L (ref 0–37)
Albumin: 4.8 g/dL (ref 3.5–5.2)
Alkaline Phosphatase: 67 U/L (ref 39–117)
BUN: 9 mg/dL (ref 6–23)
CO2: 28 meq/L (ref 19–32)
Calcium: 9.6 mg/dL (ref 8.4–10.5)
Chloride: 105 meq/L (ref 96–112)
Creatinine, Ser: 0.86 mg/dL (ref 0.40–1.50)
GFR: 118.78 mL/min (ref >60.00)
Glucose, Bld: 96 mg/dL (ref 70–99)
Potassium: 4 meq/L (ref 3.5–5.1)
Sodium: 140 meq/L (ref 135–145)
Total Bilirubin: 0.4 mg/dL (ref 0.2–1.2)
Total Protein: 7.4 g/dL (ref 6.0–8.3)

## 2023-11-01 ENCOUNTER — Ambulatory Visit: Payer: Self-pay

## 2023-11-01 NOTE — Telephone Encounter (Signed)
 FYI Only or Action Required?: FYI only for provider.  Patient was last seen in primary care on 03/07/2022 by Sebastian Beverley NOVAK, MD.  Called Nurse Triage reporting Stool Color Change.  Symptoms began yesterday.  Interventions attempted: Nothing.  Symptoms are: unchanged.  Triage Disposition: Go to ED Now (Notify PCP)  Patient/caregiver understands and will follow disposition?: Yes    Copied from CRM [id number]. Topic: Clinical - Red Word Triage >> Nov 01, 2023  9:23 AM Rea ORN wrote: Red Word that prompted transfer to Nurse Triage: Pt called to advise that since he began taking 4 new medications from 8/11, prescribed by Corean Geralds, he noticed dark tarry stool today. Reason for Disposition  Black or tarry bowel movements  (Exception: Chronic-unchanged black-grey BMs AND is taking iron pills or Pepto-Bismol.)  Answer Assessment - Initial Assessment Questions 1. APPEARANCE of BLOOD: What color is it? Is it passed separately, on the surface of the stool, or mixed in with the stool?      No blood on surface 2. AMOUNT: How much blood was passed?      None seen 3. FREQUENCY: How many times has blood been passed with the stools?      Hasn't seen blood 4. ONSET: When was the blood first seen in the stools? (Days or weeks)      Noticed dark stool last night 5. DIARRHEA: Is there also some diarrhea? If Yes, ask: How many diarrhea stools in the past 24 hours?      Yes 7-8  6. CONSTIPATION: Do you have constipation? If Yes, ask: How bad is it?     no 7. RECURRENT SYMPTOMS: Have you had blood in your stools before? If Yes, ask: When was the last time? and What happened that time?      no 8. BLOOD THINNERS: Do you take any blood thinners? (e.g., aspirin, clopidogrel / Plavix, coumadin, heparin). Notes: Other strong blood thinners include: Arixtra (fondaparinux), Eliquis (apixaban), Pradaxa (dabigatran), and Xarelto (rivaroxaban).     no 9. OTHER SYMPTOMS: Do you  have any other symptoms?  (e.g., abdomen pain, vomiting, dizziness, fever)     Mild abd pain more like cramps  Protocols used: Rectal Bleeding-A-AH

## 2023-11-02 ENCOUNTER — Encounter: Payer: Self-pay | Admitting: Family Medicine

## 2023-11-02 NOTE — Telephone Encounter (Signed)
 Spoke to patient regarding concerns. Patient stated he went to Urgent Care and is doing well with no concerns at the moment.

## 2023-11-07 NOTE — Telephone Encounter (Signed)
 Noted. Paperwork by will most likely be placed in [facility] PA folder.

## 2023-11-08 NOTE — Telephone Encounter (Signed)
 Noted

## 2023-11-22 ENCOUNTER — Encounter: Admitting: Family Medicine

## 2024-01-05 ENCOUNTER — Encounter: Payer: Self-pay | Admitting: Family Medicine

## 2024-01-31 ENCOUNTER — Encounter (HOSPITAL_COMMUNITY): Payer: Self-pay

## 2024-01-31 ENCOUNTER — Emergency Department (HOSPITAL_COMMUNITY)
Admission: EM | Admit: 2024-01-31 | Discharge: 2024-02-01 | Disposition: A | Discharge status: Home / Self Care | Attending: Emergency Medicine | Admitting: Emergency Medicine

## 2024-01-31 DIAGNOSIS — R112 Nausea with vomiting, unspecified: Secondary | ICD-10-CM | POA: Insufficient documentation

## 2024-01-31 DIAGNOSIS — R7401 Elevation of levels of liver transaminase levels: Secondary | ICD-10-CM | POA: Diagnosis not present

## 2024-01-31 LAB — CBC
HCT: 44.1 % (ref 39.0–52.0)
Hemoglobin: 15.1 g/dL (ref 13.0–17.0)
MCH: 30.6 pg (ref 26.0–34.0)
MCHC: 34.2 g/dL (ref 30.0–36.0)
MCV: 89.3 fL (ref 80.0–100.0)
Platelets: 292 K/uL (ref 150–400)
RBC: 4.94 MIL/uL (ref 4.22–5.81)
RDW: 11.4 % — ABNORMAL LOW (ref 11.5–15.5)
WBC: 8.3 K/uL (ref 4.0–10.5)
nRBC: 0 % (ref 0.0–0.2)

## 2024-01-31 LAB — COMPREHENSIVE METABOLIC PANEL WITH GFR
ALT: 51 U/L — ABNORMAL HIGH (ref 0–44)
AST: 31 U/L (ref 15–41)
Albumin: 4.4 g/dL (ref 3.5–5.0)
Alkaline Phosphatase: 67 U/L (ref 38–126)
Anion gap: 10 (ref 5–15)
BUN: 11 mg/dL (ref 6–20)
CO2: 25 mmol/L (ref 22–32)
Calcium: 9.5 mg/dL (ref 8.9–10.3)
Chloride: 105 mmol/L (ref 98–111)
Creatinine, Ser: 1.02 mg/dL (ref 0.61–1.24)
GFR, Estimated: >60 mL/min (ref >60)
Glucose, Bld: 118 mg/dL — ABNORMAL HIGH (ref 70–99)
Potassium: 4.2 mmol/L (ref 3.5–5.1)
Sodium: 140 mmol/L (ref 135–145)
Total Bilirubin: 0.5 mg/dL (ref 0.0–1.2)
Total Protein: 7.3 g/dL (ref 6.5–8.1)

## 2024-01-31 LAB — LIPASE, BLOOD: Lipase: 27 U/L (ref 11–51)

## 2024-01-31 MED ORDER — ONDANSETRON 4 MG PO TBDP
4.0000 mg | ORAL_TABLET | Freq: Once | ORAL | Status: AC
  Administered 2024-01-31: 4 mg via ORAL
  Filled 2024-01-31: qty 1

## 2024-01-31 NOTE — ED Triage Notes (Addendum)
 Pt c/o intermittent headache today and hematemesis x1 today.  Pain score 8/10.  Small amount of bright red blood noted in emesis.   Pt reports strange feeling in stomach after vomiting.   Pt reports he was exposed to cyanide earlier today.

## 2024-01-31 NOTE — ED Provider Triage Note (Signed)
 Emergency Medicine Provider Triage Evaluation Note  Joshua [name] , a 28 y.o. male  was evaluated in triage.  Pt complains of hematemesis earlier today and concerns for liver issues. He had one episode of emesis after about 3 hours of nausea while at work today. He reports a small amount of bright, red blood at the end of vomiting which he has never experienced before. He had one episode of vomiting two weeks ago. Denies bowel disturbances with last bowel movement normal and either last night or this morning. No recent fevers or illness. He uses [facility] which he states is similar to Kratom; uses about 500 mg daily for the last 3-4 months. He had discontinued this for about 2 months and previously took it daily for 9 months before that. He also reports heavy ETOH use (24 pack daily) from 28 years-old until about 1 year ago. Last alcohol intake was 1-2 beers in July 2025. He reports mild generalized burning sensation in abdomen since vomiting.  Review of Systems  Positive: Nausea, vomiting. Negative: Diarrhea, constipation, change in stool, dizziness, or syncope. Fevers or weakness.  Physical Exam  BP 135/83   Pulse 92   Temp (!) 97.5 F (36.4 C)   Resp 18   SpO2 100%  Gen:   Awake, no distress   Resp:  Normal effort. Lung sounds normal. MSK:   Moves extremities without difficulty. Other:  Skin is warm and dry with no pallor or jaundice. No scleral icterus. No abdominal tenderness or guarding to all 4 quadrants.  Medical Decision Making  Medically screening exam initiated at 6:47 PM.  Appropriate orders placed.  Joshua [name] was informed that the remainder of the evaluation will be completed by another provider, this initial triage assessment does not replace that evaluation, and the importance of remaining in the ED until their evaluation is complete.   Rosina Almarie DELENA, PA-C 01/31/24 1854

## 2024-02-01 MED ORDER — ONDANSETRON 4 MG PO TBDP
4.0000 mg | ORAL_TABLET | Freq: Once | ORAL | Status: AC
  Administered 2024-02-01: 4 mg via ORAL
  Filled 2024-02-01: qty 1

## 2024-02-01 MED ORDER — ONDANSETRON 4 MG PO TBDP: 4.0000 mg | ORAL_TABLET | Freq: Three times a day (TID) | ORAL | 0 refills | Status: AC | PRN

## 2024-02-01 MED ORDER — PANTOPRAZOLE SODIUM 40 MG PO TBEC
40.0000 mg | DELAYED_RELEASE_TABLET | Freq: Once | ORAL | Status: AC
  Administered 2024-02-01: 40 mg via ORAL
  Filled 2024-02-01: qty 1

## 2024-02-01 MED ORDER — PANTOPRAZOLE SODIUM 40 MG PO TBEC: 40.0000 mg | DELAYED_RELEASE_TABLET | Freq: Every day | ORAL | 0 refills | Status: AC

## 2024-02-01 NOTE — Discharge Instructions (Addendum)
 Liver tests today were grossly normal, one minor abnormality like we discussed but I would not be too concerned with this. Can continue to have labs monitored with primary care doctor. Take the prescribed medication as directed. Return to the ED for new or worsening symptoms.

## 2024-02-01 NOTE — ED Provider Notes (Signed)
 [facility] AT [facility] Provider Note   CSN: [id number] Arrival date & time: 01/31/24  1712     Patient presents with: Headache and Hematemesis   Joshua [name] is a 28 y.o. male.   The history is provided by the patient and medical records.  Headache  28 year old male with history of ADHD, anxiety, history of C. difficile, prior history of alcohol abuse but no alcohol use since summer, presenting to the ED after episode of hematemesis.  States for the past few months he has been using something called 7OH, similar to kratom.  He reports he took it this morning and had episode of forceful emesis shortly afterwards.  He did have a small amount of blood towards the end of emesis.  He has not had any further vomiting after this.  Continues to have a burning type sensation in his upper abdomen and in his throat since then.  He has been able to tolerate some oral fluids.  He denies any issues like this previously.  States he was concerned that he may have some liver abnormalities causing this.  Prior to Admission medications   Medication Sig Start Date End Date Taking? Authorizing Provider  ondansetron  (ZOFRAN -ODT) 4 MG disintegrating tablet Take 1 tablet (4 mg total) by mouth every 8 (eight) hours as needed. 02/01/24  Yes Jarold Olam HERO, PA-C  pantoprazole (PROTONIX) 40 MG tablet Take 1 tablet (40 mg total) by mouth daily. 02/01/24  Yes Jarold Olam HERO, PA-C  Ascorbic Acid (VITAMIN C) 100 MG tablet Take 100 mg by mouth daily.    [provider]  azelastine  (ASTELIN ) 0.1 % nasal spray Place 2 sprays into both nostrils at bedtime as needed for rhinitis. Use in each nostril as directed Patient not taking: Reported on 10/30/2023 11/12/19   Paz, Jose E, MD  dicyclomine  (BENTYL ) 20 MG tablet Take 1 tablet (20 mg total) by mouth every 6 (six) hours as needed for spasms (for abdominal cramping). 10/30/23   Waddell Krabbe, PA-C  escitalopram  (LEXAPRO ) 10 MG  tablet Take 0.5 tablets (5 mg total) by mouth daily. Patient not taking: Reported on 10/30/2023 03/07/22 04/06/22  Sebastian Beverley NOVAK, MD  fluticasone  (FLONASE ) 50 MCG/ACT nasal spray Place 2 sprays into both nostrils daily. Patient not taking: Reported on 10/30/2023 03/07/22   Sebastian Beverley NOVAK, MD  hydrOXYzine  (VISTARIL ) 25 MG capsule Take 1 capsule (25 mg total) by mouth every 8 (eight) hours as needed for anxiety (can increase to 2 capsules at bedtime if needed). 10/30/23   Waddell Krabbe, PA-C  methocarbamol  (ROBAXIN ) 500 MG tablet Take 1-2 tablets (500-1,000 mg total) by mouth every 8 (eight) hours as needed for muscle spasms (for muscle pain). 10/30/23   Waddell Krabbe, PA-C    Allergies: Hydrocodone    Review of Systems  Neurological:  Positive for headaches.    Updated Vital Signs BP 122/75 (BP Location: Right Arm)   Pulse 80   Temp 98.3 F (36.8 C) (Oral)   Resp 16   SpO2 100%   Physical Exam Vitals and nursing note reviewed.  Constitutional:      Appearance: He is well-developed.  HENT:     Head: Normocephalic and atraumatic.  Eyes:     Conjunctiva/sclera: Conjunctivae normal.     Pupils: Pupils are equal, round, and reactive to light.     Comments: Mild petechiae around the eyes, no subgingival hemorrhage  Cardiovascular:     Rate and Rhythm: Normal rate and regular  rhythm.     Heart sounds: Normal heart sounds.  Pulmonary:     Effort: Pulmonary effort is normal.     Breath sounds: Normal breath sounds.  Abdominal:     General: Bowel sounds are normal.     Palpations: Abdomen is soft.     Tenderness: There is no abdominal tenderness. There is no guarding or rebound.     Comments: Soft, non-tender, no peritoneal signs  Musculoskeletal:        General: Normal range of motion.     Cervical back: Normal range of motion.  Skin:    General: Skin is warm and dry.  Neurological:     Mental Status: He is alert and oriented to person, place, and time.     (all  labs ordered are listed, but only abnormal results are displayed) Labs Reviewed  COMPREHENSIVE METABOLIC PANEL WITH GFR - Abnormal; Notable for the following components:      Result Value   Glucose, Bld 118 (*)    ALT 51 (*)    All other components within normal limits  CBC - Abnormal; Notable for the following components:   RDW 11.4 (*)    All other components within normal limits  LIPASE, BLOOD    EKG: None  Radiology: No results found.   Procedures   Medications Ordered in the ED  pantoprazole (PROTONIX) EC tablet 40 mg (has no administration in time range)  ondansetron  (ZOFRAN -ODT) disintegrating tablet 4 mg (has no administration in time range)  ondansetron  (ZOFRAN -ODT) disintegrating tablet 4 mg (4 mg Oral Given 01/31/24 1853)                                    Medical Decision Making Risk Prescription drug management.   28 year old male presenting to the ED after single episode of hematemesis.  Has been tolerating oral fluids since then.  Concerned about his liver as he has been using 7OH, similar to Kratom.    Afebrile, nontoxic in appearance here.  Does have some petechiae around the eyes, reports episode of emesis earlier today was quite forceful.  Shows me a picture, small amount of blood present in the emesis but mostly food products.  He has been tolerating oral fluids since then.  Labs today are grossly reassuring without leukocytosis or significant electrolyte derangement.  Hemoglobin is stable.  ALT is mildly elevated at 51 but remainder of liver numbers are WNL.  I suspect he may have a small Mallory-Weiss tear that caused the episode of bleeding.  He does not have any instability suggesting esophageal perforation.  Still having some GERD like symptoms, can start PPI for now.  Close follow-up with PCP, they can continue monitoring his LFTs as needed.  Can return here for new concerns.  Final diagnoses:  Nausea and vomiting, unspecified vomiting type     ED Discharge Orders          Ordered    pantoprazole (PROTONIX) 40 MG tablet  Daily        02/01/24 0156    ondansetron  (ZOFRAN -ODT) 4 MG disintegrating tablet  Every 8 hours PRN        02/01/24 0156               Jarold Olam HERO, PA-C 02/01/24 0157    Mesner, Selinda, MD 02/01/24 [id number]

## 2024-02-07 ENCOUNTER — Inpatient Hospital Stay: Admitting: Nurse Practitioner

## 2024-02-13 ENCOUNTER — Inpatient Hospital Stay: Admitting: Nurse Practitioner
